# Patient Record
Sex: Female | Born: 1995 | Race: Black or African American | Hispanic: No | Marital: Single | Smoking: Never smoker
Health system: Southern US, Community
[De-identification: ages and names within clinical notes are randomized; demographics above are authoritative.]

## PROBLEM LIST (undated history)

## (undated) DIAGNOSIS — Z789 Other specified health status: Secondary | ICD-10-CM

---

## 2018-09-24 ENCOUNTER — Other Ambulatory Visit: Payer: Self-pay

## 2018-09-24 ENCOUNTER — Ambulatory Visit: Payer: Self-pay

## 2018-09-24 ENCOUNTER — Encounter: Payer: Self-pay | Admitting: Emergency Medicine

## 2018-09-24 ENCOUNTER — Ambulatory Visit
Admission: EM | Admit: 2018-09-24 | Discharge: 2018-09-24 | Disposition: A | Discharge status: Home / Self Care | Payer: Self-pay | Attending: Family Medicine | Admitting: Family Medicine

## 2018-09-24 DIAGNOSIS — M1990 Unspecified osteoarthritis, unspecified site: Secondary | ICD-10-CM | POA: Insufficient documentation

## 2018-09-24 MED ORDER — OSELTAMIVIR PHOSPHATE 75 MG PO CAPS: 75.0000 mg | ORAL_CAPSULE | Freq: Two times a day (BID) | ORAL | 0 refills | Status: DC

## 2018-09-24 MED ORDER — MELOXICAM 15 MG PO TABS: 15.0000 mg | ORAL_TABLET | Freq: Every day | ORAL | 0 refills | Status: DC

## 2018-09-24 NOTE — ED Provider Notes (Signed)
[facility]    CSN: [id number] Arrival date & time: 09/24/18  1234     History   Chief Complaint Chief Complaint  Patient presents with  . Knee Pain  . Shoulder Pain    HPI Amanda Marshall is a 22 y.o. female.   Pt is a 22 year old female that presents with left shoulder and left knee pain. This has been chronic over the last few months. Worsening over the last few weeks. No specific injury. Works long hours and stands on her feet all day. No heavy lifting. No fevers or  chills. She has some intermittent numbness in the left thumb.   ROS per HPI    Knee Pain  Shoulder Pain    History reviewed. No pertinent past medical history.  There are no active problems to display for this patient.   History reviewed. No pertinent surgical history.  OB History   No obstetric history on file.      Home Medications    Prior to Admission medications   Medication Sig Start Date End Date Taking? Authorizing Provider  meloxicam (MOBIC) 15 MG tablet Take 1 tablet (15 mg total) by mouth daily. 09/24/18   Janace ArisBast, Cadience Bradfield A, NP    Family History History reviewed. No pertinent family history.  Social History Social History   Tobacco Use  . Smoking status: Never Smoker  . Smokeless tobacco: Never Used  Substance Use Topics  . Alcohol use: Yes  . Drug use: Never     Allergies   Patient has no allergy information on record.   Review of Systems Review of Systems   Physical Exam Triage Vital Signs ED Triage Vitals  Enc Vitals Group     BP 09/24/18 1255 124/83     Pulse Rate 09/24/18 1255 98     Resp 09/24/18 1255 16     Temp 09/24/18 1255 98 F (36.7 C)     Temp Source 09/24/18 1255 Oral     SpO2 09/24/18 1255 97 %     Weight 09/24/18 1257 213 lb (96.6 kg)     Height 09/24/18 1257 5' (1.524 m)     Head Circumference --      Peak Flow --      Pain Score 09/24/18 1256 8     Pain Loc --      Pain Edu? --      Excl. in GC? --    No data  found.  Updated Vital Signs BP 124/83 (BP Location: Right Arm)   Pulse 98   Temp 98 F (36.7 C) (Oral)   Resp 16   Ht 5' (1.524 m)   Wt 213 lb (96.6 kg)   LMP 09/05/2018   SpO2 97%   BMI 41.60 kg/m   Visual Acuity Right Eye Distance:   Left Eye Distance:   Bilateral Distance:    Right Eye Near:   Left Eye Near:    Bilateral Near:     Physical Exam Vitals signs and nursing note reviewed.  Constitutional:      Appearance: Normal appearance. She is not ill-appearing or toxic-appearing.  HENT:     Head: Normocephalic and atraumatic.     Nose: Nose normal.  Neck:     Musculoskeletal: Normal range of motion.  Pulmonary:     Effort: Pulmonary effort is normal.  Musculoskeletal:     Comments: Tenderness to palpation of the left posterior and anterior shoulder, mostly in relation to the [facility]. No  bruising swelling or deformities.  She is able to extend the left arm but is unable to hyperextend.  Pain elicited with flexion, internal and external rotation of the shoulder.  Tenderness to palpation medial and lateral of the left patella.  Mild swelling.  No deformity, bruising, erythema.  More pain elicited with extension of the knee  Skin:    General: Skin is warm and dry.  Neurological:     Mental Status: She is alert.  Psychiatric:        Mood and Affect: Mood normal.      UC Treatments / Results  Labs (all labs ordered are listed, but only abnormal results are displayed) Labs Reviewed - No data to display  EKG None  Radiology Dg Shoulder Left  Result Date: 09/24/2018 CLINICAL DATA:  Chronic left shoulder pain for several years worsening recently. EXAM: LEFT SHOULDER - 2+ VIEW COMPARISON:  None. FINDINGS: There is no evidence of acute fracture or dislocation. There is no evidence of arthropathy or other focal bone abnormality. Soft tissues are unremarkable. IMPRESSION: No acute osseous abnormality of the left shoulder. Intact AC and glenohumeral joints.  Electronically Signed   By: Tollie Ethavid  Kwon M.D.   On: 09/24/2018 14:37   Dg Knee Complete 4 Views Left  Result Date: 09/24/2018 CLINICAL DATA:  Chronic progressive left knee pain. EXAM: LEFT KNEE - COMPLETE 4+ VIEW COMPARISON:  None. FINDINGS: No evidence of fracture, dislocation, or joint effusion. No evidence of arthropathy or other focal bone abnormality. Soft tissues are unremarkable. IMPRESSION: Negative. Electronically Signed   By: Francene BoyersJames  Maxwell M.D.   On: 09/24/2018 14:39    Procedures Procedures (including critical care time)  Medications Ordered in UC Medications - No data to display  Initial Impression / Assessment and Plan / UC Course  I have reviewed the triage vital signs and the nursing notes.  Pertinent labs & imaging results that were available during my care of the patient were reviewed by me and considered in my medical decision making (see chart for details).     X-ray was negative for any abnormalities. Most likely her pain is related to arthritis We will go ahead and treat with meloxicam daily Knee sleeve given in clinic Rest, ice, elevate She will need to follow-up with orthopedic for continued or worsening symptoms Final Clinical Impressions(s) / UC Diagnoses   Final diagnoses:  Arthritis     Discharge Instructions     Your x-rays were negative for any abnormalities I believe this is most likely arthritis related We will treat this with meloxicam daily.  Make sure you take this with food. We will give you a knee sleeve to wear on your knee I am also going to print out some exercises to do for your shoulder For continued or worsening symptoms you need to follow-up with a primary care or an orthopedic for further imaging to include MRI    ED Prescriptions    Medication Sig Dispense Auth. Provider   oseltamivir (TAMIFLU) 75 MG capsule  (Status: Discontinued) Take 1 capsule (75 mg total) by mouth every 12 (twelve) hours. 10 capsule Edoardo Laforte A, NP    meloxicam (MOBIC) 15 MG tablet Take 1 tablet (15 mg total) by mouth daily. 30 tablet Dahlia ByesBast, Hamdi Kley A, NP     Controlled Substance Prescriptions [place] Controlled Substance Registry consulted? Not Applicable   Janace ArisBast, Cheral Cappucci A, NP 09/24/18 1506

## 2018-09-24 NOTE — Discharge Instructions (Addendum)
Your x-rays were negative for any abnormalities I believe this is most likely arthritis related We will treat this with meloxicam daily.  Make sure you take this with food. We will give you a knee sleeve to wear on your knee I am also going to print out some exercises to do for your shoulder For continued or worsening symptoms you need to follow-up with a primary care or an orthopedic for further imaging to include MRI

## 2018-09-24 NOTE — ED Triage Notes (Signed)
Per pt she has been having left shoulder and knee pain for several months but for the past few days has gotten worse with pain and movement.

## 2018-11-10 ENCOUNTER — Ambulatory Visit (HOSPITAL_COMMUNITY)
Admission: EM | Admit: 2018-11-10 | Discharge: 2018-11-10 | Disposition: A | Discharge status: Home / Self Care | Payer: Self-pay | Attending: Family Medicine | Admitting: Family Medicine

## 2018-11-10 ENCOUNTER — Encounter (HOSPITAL_COMMUNITY): Payer: Self-pay | Admitting: Emergency Medicine

## 2018-11-10 ENCOUNTER — Other Ambulatory Visit: Payer: Self-pay

## 2018-11-10 DIAGNOSIS — H9201 Otalgia, right ear: Secondary | ICD-10-CM

## 2018-11-10 DIAGNOSIS — J029 Acute pharyngitis, unspecified: Secondary | ICD-10-CM

## 2018-11-10 DIAGNOSIS — H6691 Otitis media, unspecified, right ear: Secondary | ICD-10-CM

## 2018-11-10 MED ORDER — AMOXICILLIN-POT CLAVULANATE 400-57 MG PO CHEW: 2.0000 | CHEWABLE_TABLET | Freq: Two times a day (BID) | ORAL | 0 refills | Status: AC

## 2018-11-10 NOTE — Discharge Instructions (Signed)
Recommend start Augmentin chewable tablets- take 2 every 12 hours for 7 days. May take Ibuprofen 600mg  or Aleve 2 tablets as directed for pain. May continue Nyquil as needed for cough. Follow-up here in 3 to 4 days if not improving.

## 2018-11-10 NOTE — ED Provider Notes (Signed)
[facility]    CSN: [id number] Arrival date & time: 11/10/18  2563     History   Chief Complaint Chief Complaint  Patient presents with  . Sore Throat    HPI Amanda Marshall is a 23 y.o. female.   23 year old female presents with sore throat for the past 3 weeks. Also has had slight nasal congestion and cough. Had a fever 2 weeks ago but resolved. Now woke up this morning with right ear pain. Denies any GI symptoms. History of frequent ear infections- last one about 1 year ago. Has taken Nyquil and gargled with salt water with minimal relief. No other chronic health issues. Takes no daily medication.   The history is provided by the patient.    History reviewed. No pertinent past medical history.  There are no active problems to display for this patient.   History reviewed. No pertinent surgical history.  OB History   No obstetric history on file.      Home Medications    Prior to Admission medications   Medication Sig Start Date End Date Taking? Authorizing Provider  amoxicillin-clavulanate (AUGMENTIN) 400-57 MG chewable tablet Chew 2 tablets by mouth 2 (two) times daily for 7 days. 11/10/18 11/17/18  Sudie Grumbling, NP    Family History Family History  Problem Relation Age of Onset  . Hypertension Mother   . Diabetes Mother     Social History Social History   Tobacco Use  . Smoking status: Never Smoker  . Smokeless tobacco: Never Used  Substance Use Topics  . Alcohol use: Yes  . Drug use: Never     Allergies   Patient has no known allergies.   Review of Systems Review of Systems  Constitutional: Positive for fatigue. Negative for activity change, appetite change, chills and fever (not recently).  HENT: Positive for congestion, ear pain (right), postnasal drip and sore throat. Negative for ear discharge, facial swelling, hearing loss, mouth sores, nosebleeds, rhinorrhea, sinus pressure, sinus pain, sneezing and trouble swallowing.     Eyes: Negative for pain, discharge, redness and itching.  Respiratory: Positive for cough. Negative for chest tightness, shortness of breath and wheezing.   Gastrointestinal: Negative for abdominal pain, diarrhea, nausea and vomiting.  Musculoskeletal: Negative for arthralgias, myalgias, neck pain and neck stiffness.  Skin: Negative for color change, rash and wound.  Neurological: Positive for headaches. Negative for dizziness, tremors, seizures, syncope, weakness, light-headedness and numbness.  Hematological: Negative for adenopathy. Does not bruise/bleed easily.     Physical Exam Triage Vital Signs ED Triage Vitals  Enc Vitals Group     BP 11/10/18 0936 (!) 141/77     Pulse Rate 11/10/18 0936 91     Resp 11/10/18 0936 20     Temp 11/10/18 0936 98.7 F (37.1 C)     Temp Source 11/10/18 0936 Oral     SpO2 11/10/18 0936 98 %     Weight --      Height --      Head Circumference --      Peak Flow --      Pain Score 11/10/18 0940 10     Pain Loc --      Pain Edu? --      Excl. in GC? --    No data found.  Updated Vital Signs BP (!) 141/77 (BP Location: Left Arm)   Pulse 91   Temp 98.7 F (37.1 C) (Oral)   Resp 20   LMP 10/16/2018  SpO2 98%   Visual Acuity Right Eye Distance:   Left Eye Distance:   Bilateral Distance:    Right Eye Near:   Left Eye Near:    Bilateral Near:     Physical Exam Vitals signs and nursing note reviewed.  Constitutional:      General: She is awake. She is not in acute distress.    Appearance: She is well-developed and well-groomed. She is ill-appearing.     Comments: Patient sitting comfortably on exam table in no acute distress but is holding her right ear and appears in pain.   HENT:     Head: Normocephalic and atraumatic.     Right Ear: Hearing, ear canal and external ear normal. No drainage. Tympanic membrane is injected, erythematous and bulging.     Left Ear: Hearing, ear canal and external ear normal. No drainage. Tympanic  membrane is bulging. Tympanic membrane is not injected or erythematous.     Nose: Congestion present. No rhinorrhea.     Right Sinus: No maxillary sinus tenderness or frontal sinus tenderness.     Left Sinus: No maxillary sinus tenderness or frontal sinus tenderness.     Mouth/Throat:     Lips: Pink.     Mouth: Mucous membranes are moist.     Pharynx: Uvula midline. Posterior oropharyngeal erythema present. No pharyngeal swelling or oropharyngeal exudate.  Eyes:     Extraocular Movements: Extraocular movements intact.     Conjunctiva/sclera: Conjunctivae normal.  Neck:     Musculoskeletal: Normal range of motion and neck supple.  Cardiovascular:     Rate and Rhythm: Normal rate and regular rhythm.     Heart sounds: Normal heart sounds. No murmur.  Pulmonary:     Effort: Pulmonary effort is normal. No respiratory distress.     Breath sounds: Normal breath sounds and air entry. No decreased air movement. No decreased breath sounds, wheezing, rhonchi or rales.  Musculoskeletal: Normal range of motion.  Lymphadenopathy:     Cervical: Cervical adenopathy present.     Right cervical: Superficial cervical adenopathy (and tender) present.     Left cervical: Superficial cervical adenopathy present.  Skin:    General: Skin is warm and dry.     Capillary Refill: Capillary refill takes less than 2 seconds.     Findings: No rash.  Neurological:     General: No focal deficit present.     Mental Status: She is alert and oriented to person, place, and time.  Psychiatric:        Mood and Affect: Mood normal.        Behavior: Behavior normal. Behavior is cooperative.      UC Treatments / Results  Labs (all labs ordered are listed, but only abnormal results are displayed) Labs Reviewed - No data to display  EKG None  Radiology No results found.  Procedures Procedures (including critical care time)  Medications Ordered in UC Medications - No data to display  Initial Impression /  Assessment and Plan / UC Course  I have reviewed the triage vital signs and the nursing notes.  Pertinent labs & imaging results that were available during my care of the patient were reviewed by me and considered in my medical decision making (see chart for details).    Discussed with patient that she has an inner ear infection. Since patient has difficulty swallowing larger pills, will start Augmentin chewable tablets 800mg  twice a day as directed. May take Ibuprofen 600mg  every 8 hours or Aleve  2 tablets every 12 hours as needed for pain. May continue Nyquil qhs as needed for cough. Follow-up here in 3 to 4 days if not improving.   Final Clinical Impressions(s) / UC Diagnoses   Final diagnoses:  Right acute otitis media  Right ear pain  Sore throat     Discharge Instructions     Recommend start Augmentin chewable tablets- take 2 every 12 hours for 7 days. May take Ibuprofen 600mg  or Aleve 2 tablets as directed for pain. May continue Nyquil as needed for cough. Follow-up here in 3 to 4 days if not improving.     ED Prescriptions    Medication Sig Dispense Auth. Provider   amoxicillin-clavulanate (AUGMENTIN) 400-57 MG chewable tablet Chew 2 tablets by mouth 2 (two) times daily for 7 days. 28 tablet Sudie GrumblingAmyot, Shandrell Boda Berry, NP     Controlled Substance Prescriptions [place] Controlled Substance Registry consulted? Not Applicable   Sudie Grumblingmyot, Sonya Gunnoe Berry, NP 11/10/18 1645

## 2018-11-10 NOTE — ED Triage Notes (Signed)
Throat pain for 3 weeks, right ear pain started this morning

## 2019-05-11 ENCOUNTER — Encounter (HOSPITAL_COMMUNITY)
Admission: EM | Disposition: A | Discharge status: Home / Self Care | Payer: Self-pay | Source: Home / Self Care | Attending: Emergency Medicine

## 2019-05-11 ENCOUNTER — Emergency Department (HOSPITAL_COMMUNITY): Payer: Self-pay

## 2019-05-11 ENCOUNTER — Emergency Department (HOSPITAL_COMMUNITY): Payer: Self-pay | Admitting: Certified Registered"

## 2019-05-11 ENCOUNTER — Other Ambulatory Visit: Payer: Self-pay

## 2019-05-11 ENCOUNTER — Encounter (HOSPITAL_COMMUNITY): Payer: Self-pay | Admitting: Emergency Medicine

## 2019-05-11 ENCOUNTER — Observation Stay (HOSPITAL_COMMUNITY)
Admission: EM | Admit: 2019-05-11 | Discharge: 2019-05-12 | Disposition: A | Discharge status: Home / Self Care | Payer: Self-pay | Attending: General Surgery | Admitting: General Surgery

## 2019-05-11 DIAGNOSIS — I10 Essential (primary) hypertension: Secondary | ICD-10-CM | POA: Insufficient documentation

## 2019-05-11 DIAGNOSIS — K819 Cholecystitis, unspecified: Secondary | ICD-10-CM | POA: Diagnosis present

## 2019-05-11 DIAGNOSIS — K8010 Calculus of gallbladder with chronic cholecystitis without obstruction: Principal | ICD-10-CM | POA: Insufficient documentation

## 2019-05-11 DIAGNOSIS — Z20828 Contact with and (suspected) exposure to other viral communicable diseases: Secondary | ICD-10-CM | POA: Insufficient documentation

## 2019-05-11 DIAGNOSIS — R1011 Right upper quadrant pain: Secondary | ICD-10-CM

## 2019-05-11 HISTORY — PX: CHOLECYSTECTOMY: SHX55

## 2019-05-11 HISTORY — DX: Other specified health status: Z78.9

## 2019-05-11 LAB — CBC
HCT: 39.4 % (ref 36.0–46.0)
Hemoglobin: 12.7 g/dL (ref 12.0–15.0)
MCH: 27 pg (ref 26.0–34.0)
MCHC: 32.2 g/dL (ref 30.0–36.0)
MCV: 83.7 fL (ref 80.0–100.0)
Platelets: 378 10*3/uL (ref 150–400)
RBC: 4.71 MIL/uL (ref 3.87–5.11)
RDW: 14.5 % (ref 11.5–15.5)
WBC: 17 10*3/uL — ABNORMAL HIGH (ref 4.0–10.5)
nRBC: 0 % (ref 0.0–0.2)

## 2019-05-11 LAB — COMPREHENSIVE METABOLIC PANEL
ALT: 22 U/L (ref 0–44)
AST: 23 U/L (ref 15–41)
Albumin: 4.1 g/dL (ref 3.5–5.0)
Alkaline Phosphatase: 77 U/L (ref 38–126)
Anion gap: 11 (ref 5–15)
BUN: 9 mg/dL (ref 6–20)
CO2: 25 mmol/L (ref 22–32)
Calcium: 9.6 mg/dL (ref 8.9–10.3)
Chloride: 101 mmol/L (ref 98–111)
Creatinine, Ser: 0.8 mg/dL (ref 0.44–1.00)
GFR calc Af Amer: >60 mL/min (ref >60)
GFR calc non Af Amer: >60 mL/min (ref >60)
Glucose, Bld: 135 mg/dL — ABNORMAL HIGH (ref 70–99)
Potassium: 4.4 mmol/L (ref 3.5–5.1)
Sodium: 137 mmol/L (ref 135–145)
Total Bilirubin: 0.5 mg/dL (ref 0.3–1.2)
Total Protein: 7.8 g/dL (ref 6.5–8.1)

## 2019-05-11 LAB — URINALYSIS, ROUTINE W REFLEX MICROSCOPIC
Bacteria, UA: NONE SEEN
Bilirubin Urine: NEGATIVE
Glucose, UA: NEGATIVE mg/dL
Hgb urine dipstick: NEGATIVE
Ketones, ur: 5 mg/dL — AB
Leukocytes,Ua: NEGATIVE
Nitrite: NEGATIVE
Protein, ur: 30 mg/dL — AB
Specific Gravity, Urine: 1.028 (ref 1.005–1.030)
pH: 6 (ref 5.0–8.0)

## 2019-05-11 LAB — I-STAT BETA HCG BLOOD, ED (MC, WL, AP ONLY): I-stat hCG, quantitative: <5 m[IU]/mL (ref <5)

## 2019-05-11 LAB — SARS CORONAVIRUS 2 BY RT PCR (HOSPITAL ORDER, PERFORMED IN ~~LOC~~ HOSPITAL LAB): SARS Coronavirus 2: NEGATIVE

## 2019-05-11 LAB — LIPASE, BLOOD: Lipase: 25 U/L (ref 11–51)

## 2019-05-11 SURGERY — LAPAROSCOPIC CHOLECYSTECTOMY
Anesthesia: General | Site: Abdomen

## 2019-05-11 MED ORDER — BUPIVACAINE-EPINEPHRINE (PF) 0.25% -1:200000 IJ SOLN
INTRAMUSCULAR | Status: AC
  Filled 2019-05-11: qty 30

## 2019-05-11 MED ORDER — MIDAZOLAM HCL 2 MG/2ML IJ SOLN
INTRAMUSCULAR | Status: DC | PRN
  Administered 2019-05-11: 2 mg via INTRAVENOUS

## 2019-05-11 MED ORDER — PHENYLEPHRINE 40 MCG/ML (10ML) SYRINGE FOR IV PUSH (FOR BLOOD PRESSURE SUPPORT)
PREFILLED_SYRINGE | INTRAVENOUS | Status: AC
  Filled 2019-05-11: qty 10

## 2019-05-11 MED ORDER — PROPOFOL 10 MG/ML IV BOLUS
INTRAVENOUS | Status: DC | PRN
  Administered 2019-05-11: 50 mg via INTRAVENOUS
  Administered 2019-05-11: 20 mg via INTRAVENOUS
  Administered 2019-05-11: 180 mg via INTRAVENOUS

## 2019-05-11 MED ORDER — LACTATED RINGERS IV SOLN
INTRAVENOUS | Status: DC
  Administered 2019-05-11: 13:00:00 via INTRAVENOUS

## 2019-05-11 MED ORDER — SODIUM CHLORIDE 0.9% FLUSH: 3.0000 mL | Freq: Once | INTRAVENOUS | Status: DC

## 2019-05-11 MED ORDER — LIDOCAINE 2% (20 MG/ML) 5 ML SYRINGE
INTRAMUSCULAR | Status: DC | PRN
  Administered 2019-05-11: 60 mg via INTRAVENOUS

## 2019-05-11 MED ORDER — FENTANYL CITRATE (PF) 250 MCG/5ML IJ SOLN
INTRAMUSCULAR | Status: AC
  Filled 2019-05-11: qty 5

## 2019-05-11 MED ORDER — SODIUM CHLORIDE 0.9 % IV SOLN
2.0000 g | INTRAVENOUS | Status: DC
  Administered 2019-05-11: 14:00:00 2 g via INTRAVENOUS
  Filled 2019-05-11 (×2): qty 20

## 2019-05-11 MED ORDER — STERILE WATER FOR IRRIGATION IR SOLN
Status: DC | PRN
  Administered 2019-05-11: 1000 mL

## 2019-05-11 MED ORDER — ROCURONIUM BROMIDE 10 MG/ML (PF) SYRINGE
PREFILLED_SYRINGE | INTRAVENOUS | Status: AC
  Filled 2019-05-11: qty 10

## 2019-05-11 MED ORDER — DEXAMETHASONE SODIUM PHOSPHATE 10 MG/ML IJ SOLN
INTRAMUSCULAR | Status: DC | PRN
  Administered 2019-05-11: 10 mg via INTRAVENOUS

## 2019-05-11 MED ORDER — OXYCODONE HCL 5 MG/5ML PO SOLN: 5.0000 mg | Freq: Once | ORAL | Status: AC | PRN

## 2019-05-11 MED ORDER — SODIUM CHLORIDE 0.9 % IV SOLN
INTRAVENOUS | Status: DC
  Administered 2019-05-11: 11:00:00 via INTRAVENOUS

## 2019-05-11 MED ORDER — 0.9 % SODIUM CHLORIDE (POUR BTL) OPTIME
TOPICAL | Status: DC | PRN
  Administered 2019-05-11: 1000 mL

## 2019-05-11 MED ORDER — CHLORHEXIDINE GLUCONATE CLOTH 2 % EX PADS: 6.0000 | MEDICATED_PAD | Freq: Once | CUTANEOUS | Status: DC

## 2019-05-11 MED ORDER — OXYCODONE HCL 5 MG PO TABS
5.0000 mg | ORAL_TABLET | Freq: Once | ORAL | Status: AC | PRN
  Administered 2019-05-11: 5 mg via ORAL

## 2019-05-11 MED ORDER — FENTANYL CITRATE (PF) 100 MCG/2ML IJ SOLN: 25.0000 ug | INTRAMUSCULAR | Status: DC | PRN

## 2019-05-11 MED ORDER — FENTANYL CITRATE (PF) 100 MCG/2ML IJ SOLN
100.0000 ug | Freq: Once | INTRAMUSCULAR | Status: AC
  Administered 2019-05-11: 100 ug via INTRAVENOUS
  Filled 2019-05-11: qty 2

## 2019-05-11 MED ORDER — ENOXAPARIN SODIUM 60 MG/0.6ML ~~LOC~~ SOLN
50.0000 mg | SUBCUTANEOUS | Status: DC
  Administered 2019-05-12: 50 mg via SUBCUTANEOUS
  Filled 2019-05-11: qty 0.6

## 2019-05-11 MED ORDER — SODIUM CHLORIDE 0.9 % IV SOLN
INTRAVENOUS | Status: DC
  Administered 2019-05-11: 18:00:00 via INTRAVENOUS

## 2019-05-11 MED ORDER — OXYCODONE HCL 5 MG PO TABS
5.0000 mg | ORAL_TABLET | Freq: Four times a day (QID) | ORAL | Status: DC | PRN
  Administered 2019-05-12: 5 mg via ORAL
  Filled 2019-05-11: qty 1

## 2019-05-11 MED ORDER — KETOROLAC TROMETHAMINE 15 MG/ML IJ SOLN
15.0000 mg | Freq: Four times a day (QID) | INTRAMUSCULAR | Status: DC
  Administered 2019-05-11 – 2019-05-12 (×3): 15 mg via INTRAVENOUS
  Filled 2019-05-11 (×3): qty 1

## 2019-05-11 MED ORDER — MIDAZOLAM HCL 2 MG/2ML IJ SOLN
INTRAMUSCULAR | Status: AC
  Filled 2019-05-11: qty 2

## 2019-05-11 MED ORDER — ONDANSETRON HCL 4 MG/2ML IJ SOLN: 4.0000 mg | Freq: Once | INTRAMUSCULAR | Status: DC

## 2019-05-11 MED ORDER — ONDANSETRON HCL 4 MG/2ML IJ SOLN
4.0000 mg | Freq: Once | INTRAMUSCULAR | Status: AC
  Administered 2019-05-11: 4 mg via INTRAVENOUS
  Filled 2019-05-11: qty 2

## 2019-05-11 MED ORDER — ACETAMINOPHEN 500 MG PO TABS
1000.0000 mg | ORAL_TABLET | Freq: Four times a day (QID) | ORAL | Status: DC
  Administered 2019-05-11 – 2019-05-12 (×4): 1000 mg via ORAL
  Filled 2019-05-11 (×4): qty 2

## 2019-05-11 MED ORDER — GABAPENTIN 300 MG PO CAPS
300.0000 mg | ORAL_CAPSULE | ORAL | Status: AC
  Administered 2019-05-11: 300 mg via ORAL
  Filled 2019-05-11: qty 1

## 2019-05-11 MED ORDER — SUCCINYLCHOLINE CHLORIDE 20 MG/ML IJ SOLN
INTRAMUSCULAR | Status: DC | PRN
  Administered 2019-05-11: 140 mg via INTRAVENOUS

## 2019-05-11 MED ORDER — BUPIVACAINE-EPINEPHRINE 0.25% -1:200000 IJ SOLN
INTRAMUSCULAR | Status: DC | PRN
  Administered 2019-05-11: 13 mL

## 2019-05-11 MED ORDER — PROPOFOL 10 MG/ML IV BOLUS
INTRAVENOUS | Status: AC
  Filled 2019-05-11: qty 20

## 2019-05-11 MED ORDER — DEXAMETHASONE SODIUM PHOSPHATE 10 MG/ML IJ SOLN
INTRAMUSCULAR | Status: AC
  Filled 2019-05-11: qty 1

## 2019-05-11 MED ORDER — ACETAMINOPHEN 500 MG PO TABS
1000.0000 mg | ORAL_TABLET | ORAL | Status: AC
  Administered 2019-05-11: 13:00:00 1000 mg via ORAL
  Filled 2019-05-11: qty 2

## 2019-05-11 MED ORDER — OXYCODONE HCL 5 MG PO TABS
ORAL_TABLET | ORAL | Status: AC
  Filled 2019-05-11: qty 1

## 2019-05-11 MED ORDER — ONDANSETRON 4 MG PO TBDP: 4.0000 mg | ORAL_TABLET | Freq: Four times a day (QID) | ORAL | Status: DC | PRN

## 2019-05-11 MED ORDER — SUGAMMADEX SODIUM 200 MG/2ML IV SOLN
INTRAVENOUS | Status: DC | PRN
  Administered 2019-05-11: 200 mg via INTRAVENOUS

## 2019-05-11 MED ORDER — ONDANSETRON HCL 4 MG/2ML IJ SOLN: 4.0000 mg | Freq: Four times a day (QID) | INTRAMUSCULAR | Status: DC | PRN

## 2019-05-11 MED ORDER — SIMETHICONE 80 MG PO CHEW: 40.0000 mg | CHEWABLE_TABLET | Freq: Four times a day (QID) | ORAL | Status: DC | PRN

## 2019-05-11 MED ORDER — ROCURONIUM BROMIDE 10 MG/ML (PF) SYRINGE
PREFILLED_SYRINGE | INTRAVENOUS | Status: DC | PRN
  Administered 2019-05-11: 50 mg via INTRAVENOUS

## 2019-05-11 MED ORDER — SUCCINYLCHOLINE CHLORIDE 200 MG/10ML IV SOSY
PREFILLED_SYRINGE | INTRAVENOUS | Status: AC
  Filled 2019-05-11: qty 10

## 2019-05-11 MED ORDER — METHOCARBAMOL 500 MG PO TABS: 500.0000 mg | ORAL_TABLET | Freq: Four times a day (QID) | ORAL | Status: DC | PRN

## 2019-05-11 MED ORDER — ONDANSETRON HCL 4 MG/2ML IJ SOLN
INTRAMUSCULAR | Status: DC | PRN
  Administered 2019-05-11: 4 mg via INTRAVENOUS

## 2019-05-11 MED ORDER — LIDOCAINE 2% (20 MG/ML) 5 ML SYRINGE
INTRAMUSCULAR | Status: AC
  Filled 2019-05-11: qty 5

## 2019-05-11 MED ORDER — FENTANYL CITRATE (PF) 100 MCG/2ML IJ SOLN
INTRAMUSCULAR | Status: DC | PRN
  Administered 2019-05-11: 50 ug via INTRAVENOUS
  Administered 2019-05-11 (×2): 100 ug via INTRAVENOUS
  Administered 2019-05-11 (×2): 50 ug via INTRAVENOUS

## 2019-05-11 MED ORDER — MORPHINE SULFATE (PF) 4 MG/ML IV SOLN
4.0000 mg | Freq: Once | INTRAVENOUS | Status: AC
  Administered 2019-05-11: 06:00:00 4 mg via INTRAVENOUS
  Filled 2019-05-11: qty 1

## 2019-05-11 MED ORDER — SODIUM CHLORIDE 0.9 % IR SOLN
Status: DC | PRN
  Administered 2019-05-11: 1000 mL

## 2019-05-11 MED ORDER — HYDROMORPHONE HCL 1 MG/ML IJ SOLN: 1.0000 mg | Freq: Once | INTRAMUSCULAR | Status: DC

## 2019-05-11 MED ORDER — MORPHINE SULFATE (PF) 2 MG/ML IV SOLN: 1.0000 mg | INTRAVENOUS | Status: DC | PRN

## 2019-05-11 SURGICAL SUPPLY — 43 items
APPLIER CLIP 5 13 M/L LIGAMAX5 (MISCELLANEOUS) ×3
BLADE CLIPPER SURG (BLADE) ×3 IMPLANT
CANISTER SUCT 3000ML PPV (MISCELLANEOUS) ×3 IMPLANT
CHLORAPREP W/TINT 26 (MISCELLANEOUS) ×3 IMPLANT
CLIP APPLIE 5 13 M/L LIGAMAX5 (MISCELLANEOUS) ×1 IMPLANT
CLOSURE WOUND 1/2 X4 (GAUZE/BANDAGES/DRESSINGS) ×1
COVER SURGICAL LIGHT HANDLE (MISCELLANEOUS) ×3 IMPLANT
DERMABOND ADVANCED (GAUZE/BANDAGES/DRESSINGS) ×2
DERMABOND ADVANCED .7 DNX12 (GAUZE/BANDAGES/DRESSINGS) ×1 IMPLANT
DEVICE TROCAR PUNCTURE CLOSURE (ENDOMECHANICALS) ×3 IMPLANT
ELECT REM PT RETURN 9FT ADLT (ELECTROSURGICAL) ×3
ELECTRODE REM PT RTRN 9FT ADLT (ELECTROSURGICAL) ×1 IMPLANT
GLOVE BIO SURGEON STRL SZ 6.5 (GLOVE) ×2 IMPLANT
GLOVE BIO SURGEON STRL SZ7 (GLOVE) ×3 IMPLANT
GLOVE BIO SURGEON STRL SZ7.5 (GLOVE) ×3 IMPLANT
GLOVE BIO SURGEONS STRL SZ 6.5 (GLOVE) ×1
GLOVE BIOGEL PI IND STRL 7.0 (GLOVE) ×1 IMPLANT
GLOVE BIOGEL PI IND STRL 7.5 (GLOVE) ×3 IMPLANT
GLOVE BIOGEL PI INDICATOR 7.0 (GLOVE) ×2
GLOVE BIOGEL PI INDICATOR 7.5 (GLOVE) ×6
GLOVE ECLIPSE 7.5 STRL STRAW (GLOVE) ×3 IMPLANT
GOWN STRL REUS W/ TWL LRG LVL3 (GOWN DISPOSABLE) ×4 IMPLANT
GOWN STRL REUS W/TWL LRG LVL3 (GOWN DISPOSABLE) ×8
GRASPER SUT TROCAR 14GX15 (MISCELLANEOUS) ×3 IMPLANT
KIT BASIN OR (CUSTOM PROCEDURE TRAY) ×3 IMPLANT
KIT TURNOVER KIT B (KITS) ×3 IMPLANT
NS IRRIG 1000ML POUR BTL (IV SOLUTION) ×3 IMPLANT
PAD ARMBOARD 7.5X6 YLW CONV (MISCELLANEOUS) ×3 IMPLANT
POUCH RETRIEVAL ECOSAC 10 (ENDOMECHANICALS) ×1 IMPLANT
POUCH RETRIEVAL ECOSAC 10MM (ENDOMECHANICALS) ×2
SCISSORS LAP 5X35 DISP (ENDOMECHANICALS) ×3 IMPLANT
SET IRRIG TUBING LAPAROSCOPIC (IRRIGATION / IRRIGATOR) ×3 IMPLANT
SET TUBE SMOKE EVAC HIGH FLOW (TUBING) ×3 IMPLANT
SLEEVE ENDOPATH XCEL 5M (ENDOMECHANICALS) ×6 IMPLANT
SPECIMEN JAR SMALL (MISCELLANEOUS) ×3 IMPLANT
STRIP CLOSURE SKIN 1/2X4 (GAUZE/BANDAGES/DRESSINGS) ×2 IMPLANT
SUT MNCRL AB 4-0 PS2 18 (SUTURE) ×3 IMPLANT
SUT VICRYL 0 UR6 27IN ABS (SUTURE) ×6 IMPLANT
TOWEL GREEN STERILE FF (TOWEL DISPOSABLE) ×3 IMPLANT
TRAY LAPAROSCOPIC MC (CUSTOM PROCEDURE TRAY) ×3 IMPLANT
TROCAR XCEL BLUNT TIP 100MML (ENDOMECHANICALS) ×3 IMPLANT
TROCAR XCEL NON-BLD 5MMX100MML (ENDOMECHANICALS) ×3 IMPLANT
WATER STERILE IRR 1000ML POUR (IV SOLUTION) ×3 IMPLANT

## 2019-05-11 NOTE — Anesthesia Preprocedure Evaluation (Addendum)
Anesthesia Evaluation  Patient identified by MRN, date of birth, ID band Patient awake    Reviewed: Allergy & Precautions, H&P , NPO status , Patient's Chart, lab work & pertinent test results  Airway Mallampati: II  TM Distance: >3 FB Neck ROM: Full    Dental  (+) Teeth Intact, Dental Advisory Given   Pulmonary neg pulmonary ROS,    breath sounds clear to auscultation       Cardiovascular negative cardio ROS   Rhythm:regular Rate:Normal     Neuro/Psych    GI/Hepatic cholecystitis   Endo/Other    Renal/GU      Musculoskeletal   Abdominal   Peds  Hematology   Anesthesia Other Findings   Reproductive/Obstetrics                            Anesthesia Physical Anesthesia Plan  ASA: II  Anesthesia Plan: General   Post-op Pain Management:    Induction: Intravenous, Rapid sequence and Cricoid pressure planned  PONV Risk Score and Plan: 3 and Ondansetron, Dexamethasone, Midazolam and Treatment may vary due to age or medical condition  Airway Management Planned: Oral ETT  Additional Equipment:   Intra-op Plan:   Post-operative Plan: Extubation in OR  Informed Consent: I have reviewed the patients History and Physical, chart, labs and discussed the procedure including the risks, benefits and alternatives for the proposed anesthesia with the patient or authorized representative who has indicated his/her understanding and acceptance.     Dental advisory given  Plan Discussed with: CRNA, Anesthesiologist and Surgeon  Anesthesia Plan Comments:        Anesthesia Quick Evaluation

## 2019-05-11 NOTE — ED Provider Notes (Addendum)
  Physical Exam  BP (!) 107/97   Pulse 82   Temp 98 F (36.7 C) (Oral)   Resp 16   SpO2 99%   Physical Exam  ED Course/Procedures   Clinical Course as of May 10 716  Thu May 11, 2019  0715 Patient recheck.  She continues to endorse severe, 9 out of 10 pain.  Given the patient's current blood pressure is 107/97, will order fentanyl and plan for reassessment.  If he is not improved on reassessment, will consider general surgery consult.   [MM]    Clinical Course User Index [MM] McDonald, Mia A, PA-C    Procedures  MDM  7:17 AM Patient care assumed from [place] PA at shift change, please see her note for a full HPI. Briefly, patient with 3 weeks of right upper quadrant pain, waxing and waning worse after eating.  No vomiting, has some nausea.  Had ultrasound performed which shows 82mm gallbladder stones.  Does have a white count of 17.0, pain was not controlled after 4 mg of morphine, did receive an additional 100 mics of fentanyl as her pressures were noted to be soft 107/97 will reassess patient after +/-General surgery consult.  07:45 AM patient reevaluated by me, reports some improvement in pain after fentanyl.  Will attempt p.o. challenge. 8:50 AM patient reevaluated by me, pain has now returned on the right upper quadrant, she reports nausea.  Zofran has been ordered and a general surgery consult has been placed, spoke to [place] PA who will evaluate patient in the ED.  Pressures remain soft 11/53, will hold off on medication until general surgery recommendations.  9:24 AM surgery consult pending, COVID swab ordered. 10:38 AM surgery consult performed by Janett Billow PA, please see her note for a full HPI.  Briefly, patient likely to go for cholecystectomy today, unknown on time.  Likely will be discharged from PACU.   10:54 AM patient rechecked by me, reports her pain is now at a 3, she is currently receiving a liter of fluids.  Patient likely to go for surgery today updated on  standby orders per surgery.   Portions of this note were generated with [employer]. Dictation errors may occur despite best attempts at proofreading.    Janeece Fitting, PA-C 05/11/19 1039    Janeece Fitting, PA-C 05/11/19 1054    Hayden Rasmussen, MD 05/11/19 [phone]

## 2019-05-11 NOTE — Op Note (Signed)
Preoperative diagnosis:acute cholecystitis Postoperative diagnosis: Same as above Chronic cholecystitis Procedure: Laparoscopic cholecystectomy Surgeon: Dr. Serita Grammes Assistant: Margie Billet, PA-C Anesthesia: General Specimens: Gallbladder and contents to pathology Complications: None Drains: None Sponge needle count was correct at completion Disposition to recovery stable condition  Indications: This is a 63 yof with ruq pain, gallstones on [place] and a tender ruq. She appears to have cholecystitis.   I discussed with her proceeding with a laparoscopic cholecystectomy.   Procedure: After informed consent was obtained the patient was taken to the OR.    Antibiotics had been given.  SCDs were in place.  She was then prepped and draped in the standard sterile surgical fashion after already undergoing general anesthesia. Surgical timeout was then performed.  I infiltrated Marcaine below the umbilicus.  I made a vertical incision.  I grasped the fascia and incised this sharply.  The peritoneum was entered bluntly.  I then placed a 0 Vicryl pursestring suture through the fascia.  I inserted a Hassan trocar and insufflated the abdomen to 15 mmHg pressure.  I then placed 3 further 5 mm trocars in the epigastrium and right upper quadrant under direct vision without complication.  She was noted to have acute cholecystitis.  Her gallbladder was retracted cephalad and lateral.  I was able to dissect the triangle and get the critical view of safety.  I then clipped the cystic duct leaving 2 clips in place.  These clips completely traversed the duct and the duct was viable.  I treated the cystic artery in a similar fashion.   Cautery was then used to remove the gallbladder from the liver bed.  It was then placed in a retrieval bag and removed the umbilicus.  Hemostasis was observed.  There was no spillage of bile.  I then placed and I then remove the [facility] trocar and tied down my pursestring.  Due to her  body habitus I did place 3 additional 0 Vicryl sutures using the suture passer device at the umbilicus.  The remaining trocars were removed and the abdomen was desufflated.  These were all closed with 4-0 Monocryl, glue, and Steri-Strips.  She tolerated this well was extubated and transferred to recovery stable.

## 2019-05-11 NOTE — H&P (Addendum)
[facility] Surgery Consult/Admission Note  Amanda Marshall 1995/10/15  [id number].    Requesting Provider: Dr. Melina Copa Chief Complaint/Reason for Consult: RUQ abdominal pain  HPI:   Pt is an otherwise healthy, obese, 23 yo who presented to the ED with complaints of abdominal pain. Pt states she has been having intermittent, severe, RUQ abdominal pain, radiating into her back after eating for the last 3 weeks. She never had this pain before. Associated N&V. She has been having bouts of diarrhea followed by constipation since the onset of pain. No blood in stool or vomiting. No fevers or chills, urinary issues or other complaints. She is a stay at home mom. She denies previous abdominal surgeries, blood thinners or daily medications. No known allergies.   ROS:  Review of Systems  Constitutional: Negative for chills, diaphoresis and fever.  HENT: Negative for sore throat.   Respiratory: Negative for cough and shortness of breath.   Cardiovascular: Negative for chest pain.  Gastrointestinal: Positive for abdominal pain, constipation, diarrhea, nausea and vomiting. Negative for blood in stool.  Genitourinary: Negative for dysuria.  Skin: Negative for rash.  Neurological: Negative for dizziness and loss of consciousness.  All other systems reviewed and are negative.    Family History  Problem Relation Age of Onset  . Hypertension Mother   . Diabetes Mother     History reviewed. No pertinent past medical history.  History reviewed. No pertinent surgical history.  Social History:  reports that she has never smoked. She has never used smokeless tobacco. She reports current alcohol use. She reports that she does not use drugs.  Allergies: No Known Allergies  (Not in a hospital admission)   Blood pressure (!) 99/53, pulse 66, temperature 98 F (36.7 C), temperature source Oral, resp. rate 16, SpO2 97 %.  Physical Exam Constitutional:      General: She is not in acute  distress.    Appearance: Normal appearance. She is obese. She is not ill-appearing, toxic-appearing or diaphoretic.  HENT:     Head: Normocephalic and atraumatic.     Nose: Nose normal.     Mouth/Throat:     Comments: Pt wearing mask Eyes:     General: No scleral icterus.       Right eye: No discharge.        Left eye: No discharge.     Conjunctiva/sclera: Conjunctivae normal.     Pupils: Pupils are equal, round, and reactive to light.  Neck:     Musculoskeletal: Normal range of motion and neck supple.  Cardiovascular:     Rate and Rhythm: Normal rate and regular rhythm.     Pulses:          Radial pulses are 2+ on the right side and 2+ on the left side.       Dorsalis pedis pulses are 2+ on the right side and 2+ on the left side.     Heart sounds: Normal heart sounds. No murmur.  Pulmonary:     Effort: Pulmonary effort is normal. No respiratory distress.     Breath sounds: Normal breath sounds. No wheezing, rhonchi or rales.  Abdominal:     General: Bowel sounds are normal. There is no distension.     Palpations: Abdomen is soft. Abdomen is not rigid. There is no hepatomegaly or splenomegaly.     Tenderness: There is abdominal tenderness in the right upper quadrant. There is guarding.     Hernia: No hernia is present.  Musculoskeletal: Normal range  of motion.        General: No tenderness or deformity.     Right lower leg: No edema.     Left lower leg: No edema.  Skin:    General: Skin is warm and dry.     Findings: No rash.  Neurological:     Mental Status: She is alert and oriented to person, place, and time.  Psychiatric:        Mood and Affect: Mood normal.        Behavior: Behavior normal.     Results for orders placed or performed during the hospital encounter of 05/11/19 (from the past 48 hour(s))  Lipase, blood     Status: None   Collection Time: 05/11/19  2:05 AM  Result Value Ref Range   Lipase 25 11 - 51 U/L    Comment: Performed at [facility], [address] [place], [place] [address]  Comprehensive metabolic panel     Status: Abnormal   Collection Time: 05/11/19  2:05 AM  Result Value Ref Range   Sodium 137 135 - 145 mmol/L   Potassium 4.4 3.5 - 5.1 mmol/L   Chloride 101 98 - 111 mmol/L   CO2 25 22 - 32 mmol/L   Glucose, Bld 135 (H) 70 - 99 mg/dL   BUN 9 6 - 20 mg/dL   Creatinine, Ser 6.040.80 0.44 - 1.00 mg/dL   Calcium 9.6 8.9 - 54.010.3 mg/dL   Total Protein 7.8 6.5 - 8.1 g/dL   Albumin 4.1 3.5 - 5.0 g/dL   AST 23 15 - 41 U/L   ALT 22 0 - 44 U/L   Alkaline Phosphatase 77 38 - 126 U/L   Total Bilirubin 0.5 0.3 - 1.2 mg/dL   GFR calc non Af Amer >60 >60 mL/min   GFR calc Af Amer >60 >60 mL/min   Anion gap 11 5 - 15    Comment: Performed at [facility], [address] [place], [place] [address]  CBC     Status: Abnormal   Collection Time: 05/11/19  2:05 AM  Result Value Ref Range   WBC 17.0 (H) 4.0 - 10.5 K/uL   RBC 4.71 3.87 - 5.11 MIL/uL   Hemoglobin 12.7 12.0 - 15.0 g/dL   HCT 14.739.4 82.936.0 - 56.246.0 %   MCV 83.7 80.0 - 100.0 fL   MCH 27.0 26.0 - 34.0 pg   MCHC 32.2 30.0 - 36.0 g/dL   RDW 13.014.5 86.511.5 - 78.415.5 %   Platelets 378 150 - 400 K/uL   nRBC 0.0 0.0 - 0.2 %    Comment: Performed at [facility], [address] [place], [place] [address]  I-Stat beta hCG blood, ED     Status: None   Collection Time: 05/11/19  2:14 AM  Result Value Ref Range   I-stat hCG, quantitative <5.0 <5 mIU/mL   Comment 3            Comment:   GEST. AGE      CONC.  (mIU/mL)   <=1 WEEK        5 - 50     2 WEEKS       50 - 500     3 WEEKS       100 - 10,000     4 WEEKS     1,000 - 30,000        FEMALE AND NON-PREGNANT FEMALE:     LESS THAN  5 mIU/mL   Urinalysis, Routine w reflex microscopic     Status: Abnormal   Collection Time: 05/11/19  2:15 AM  Result Value Ref Range   Color, Urine YELLOW YELLOW   APPearance HAZY (A) CLEAR   Specific Gravity, Urine 1.028 1.005 - 1.030   pH 6.0 5.0 - 8.0   Glucose, UA NEGATIVE NEGATIVE  mg/dL   Hgb urine dipstick NEGATIVE NEGATIVE   Bilirubin Urine NEGATIVE NEGATIVE   Ketones, ur 5 (A) NEGATIVE mg/dL   Protein, ur 30 (A) NEGATIVE mg/dL   Nitrite NEGATIVE NEGATIVE   Leukocytes,Ua NEGATIVE NEGATIVE   RBC / HPF 0-5 0 - 5 RBC/hpf   WBC, UA 0-5 0 - 5 WBC/hpf   Bacteria, UA NONE SEEN NONE SEEN   Squamous Epithelial / LPF 0-5 0 - 5   Mucus PRESENT    Hyaline Casts, UA PRESENT     Comment: Performed at [facility], [address] [place], [place] [address]   [place] Abdomen Limited Ruq  Result Date: 05/11/2019 CLINICAL DATA:  Right upper quadrant pain for 3 weeks EXAM: ULTRASOUND ABDOMEN LIMITED RIGHT UPPER QUADRANT COMPARISON:  None. FINDINGS: Gallbladder: Two shadowing gallstones measuring up to 16 mm. No wall thickening or focal tenderness. Common bile duct: Diameter: 3 mm Liver: No focal lesion identified. Within normal limits in parenchymal echogenicity. Portal vein is patent on color Doppler imaging with normal direction of blood flow towards the liver. IMPRESSION: Cholelithiasis without evidence of cholecystitis. Electronically Signed   By: Marnee SpringJonathon  Watts M.D.   On: 05/11/2019 06:50      Assessment/Plan Active Problems:   * No active hospital problems. *  Symptomatic cholelithiasis, probably cholecystitis with a WBC of 17 - afebrile, labs WNL - RUQ showed 2 stones at 16mm, no cholecystitis  - or today for lap chole with possible discharge from pacu   Jerre SimonJessica L Iolani Twilley, [facility] 05/11/2019, 9:48 AM Pager: [phone] Consults: [phone] Mon-Fri 7:00 am-4:30 pm Sat-Sun 7:00 am-11:30 am

## 2019-05-11 NOTE — ED Notes (Signed)
Pt c/o upper abd pain  For 3 weeks  Worse last pm after she ate fried foods  She has been vomiting since last pm

## 2019-05-11 NOTE — ED Triage Notes (Signed)
Pt c/o RUQ pain x 3 weeks, worse after eating certain foods. Denies nausea/vomiting.

## 2019-05-11 NOTE — Discharge Instructions (Signed)
[facility] ° °Please arrive at least 30 min before your appointment to complete your check in paperwork.  If you are unable to arrive 30 min prior to your appointment time we may have to cancel or reschedule you. °LAPAROSCOPIC SURGERY: POST OP INSTRUCTIONS °Always review your discharge instruction sheet given to you by the facility where your surgery was performed. °IF YOU HAVE DISABILITY OR FAMILY LEAVE FORMS, YOU MUST BRING THEM TO THE OFFICE FOR PROCESSING.   °DO NOT GIVE THEM TO YOUR DOCTOR. ° °PAIN CONTROL ° °1. First take acetaminophen (Tylenol) AND/or ibuprofen (Advil) to control your pain after surgery.  Follow directions on package.  Taking acetaminophen (Tylenol) and/or ibuprofen (Advil) regularly after surgery will help to control your pain and lower the amount of prescription pain medication you may need.  You should not take more than 4,000 mg (4 grams) of acetaminophen (Tylenol) in 24 hours.  You should not take ibuprofen (Advil), aleve, motrin, naprosyn or other NSAIDS if you have a history of stomach ulcers or chronic kidney disease.  °2. A prescription for pain medication may be given to you upon discharge.  Take your pain medication as prescribed, if you still have uncontrolled pain after taking acetaminophen (Tylenol) or ibuprofen (Advil). °3. Use ice packs to help control pain. °4. If you need a refill on your pain medication, please contact your pharmacy.  They will contact our office to request authorization. Prescriptions will not be filled after 5pm or on week-ends. ° °HOME MEDICATIONS °5. Take your usually prescribed medications unless otherwise directed. ° °DIET °6. You should follow a light diet the first few days after arrival home.  Be sure to include lots of fluids daily. Avoid fatty, fried foods.  ° °CONSTIPATION °7. It is common to experience some constipation after surgery and if you are taking pain medication.  Increasing fluid intake and taking a stool  softener (such as Colace) will usually help or prevent this problem from occurring.  A mild laxative (Milk of Magnesia or Miralax) should be taken according to package instructions if there are no bowel movements after 48 hours. ° °WOUND/INCISION CARE °8. Most patients will experience some swelling and bruising in the area of the incisions.  Ice packs will help.  Swelling and bruising can take several days to resolve.  °9. Unless discharge instructions indicate otherwise, follow guidelines below  °a. STERI-STRIPS - you may remove your outer bandages 48 hours after surgery, and you may shower at that time.  You have steri-strips (small skin tapes) in place directly over the incision.  These strips should be left on the skin for 7-10 days.   °b. DERMABOND/SKIN GLUE - you may shower in 24 hours.  The glue will flake off over the next 2-3 weeks. °10. Any sutures or staples will be removed at the office during your follow-up visit. ° °ACTIVITIES °11. You may resume regular (light) daily activities beginning the next day--such as daily self-care, walking, climbing stairs--gradually increasing activities as tolerated.  You may have sexual intercourse when it is comfortable.  Refrain from any heavy lifting or straining until approved by your doctor. °a. You may drive when you are no longer taking prescription pain medication, you can comfortably wear a seatbelt, and you can safely maneuver your car and apply brakes. ° °FOLLOW-UP °12. You should see your doctor in the office for a follow-up appointment approximately 2-3 weeks after your surgery.  You should have been given your post-op/follow-up appointment when   your surgery was scheduled.  If you did not receive a post-op/follow-up appointment, make sure that you call for this appointment within a day or two after you arrive home to insure a convenient appointment time. ° °OTHER INSTRUCTIONS ° °WHEN TO CALL YOUR DOCTOR: °1. Fever over 101.0 °2. Inability to  urinate °3. Continued bleeding from incision. °4. Increased pain, redness, or drainage from the incision. °5. Increasing abdominal pain ° °The clinic staff is available to answer your questions during regular business hours.  Please don’t hesitate to call and ask to speak to one of the nurses for clinical concerns.  If you have a medical emergency, go to the nearest emergency room or call 911.  A surgeon from [facility] is always on call at the hospital. °[address], [address], [place], [place]  [address] ? [address] [address], [place], [place]   [address] °[phone] ? [phone] ? FAX [phone] ° ° ° °

## 2019-05-11 NOTE — ED Provider Notes (Signed)
[facility] EMERGENCY DEPARTMENT Provider Note   CSN: [id number] Arrival date & time: 05/11/19  0144    History   Chief Complaint Chief Complaint  Patient presents with  . Abdominal Pain    HPI Amanda Marshall is a 23 y.o. female with no pertinent past medical history who presents to the emergency department with a chief complaint of abdominal pain.  The patient endorses right upper quadrant pain that has been constant for the last 3 weeks.  She reports the pain has been waxing and waning and is worse after eating.  She reports that the pain significantly worsened tonight after dinner that she developed nausea and NBNB vomiting.  She reports aggravating factors include eating and no known alleviating factors.  She denies fever, chills, back pain, dysuria, hematuria, vaginal pain or discharge, chest pain, shortness of breath.  No treatment prior to arrival.  No history of abdominal surgery.     The history is provided by the patient. No language interpreter was used.    History reviewed. No pertinent past medical history.  There are no active problems to display for this patient.   History reviewed. No pertinent surgical history.   OB History   No obstetric history on file.      Home Medications    Prior to Admission medications   Not on File    Family History Family History  Problem Relation Age of Onset  . Hypertension Mother   . Diabetes Mother     Social History Social History   Tobacco Use  . Smoking status: Never Smoker  . Smokeless tobacco: Never Used  Substance Use Topics  . Alcohol use: Yes  . Drug use: Never     Allergies   Patient has no known allergies.   Review of Systems Review of Systems  Constitutional: Negative for activity change, chills and fever.  Respiratory: Negative for shortness of breath.   Cardiovascular: Negative for chest pain.  Gastrointestinal: Positive for abdominal pain, nausea and vomiting.  Negative for blood in stool, diarrhea and rectal pain.  Genitourinary: Negative for dysuria, flank pain, hematuria, urgency, vaginal discharge and vaginal pain.  Musculoskeletal: Negative for back pain.  Skin: Negative for rash.  Allergic/Immunologic: Negative for immunocompromised state.  Neurological: Negative for seizures, weakness and headaches.  Psychiatric/Behavioral: Negative for confusion.     Physical Exam Updated Vital Signs BP (!) 111/53   Pulse 82   Temp 98 F (36.7 C) (Oral)   Resp 16   SpO2 99%   Physical Exam Vitals signs and nursing note reviewed.  Constitutional:      General: She is not in acute distress. HENT:     Head: Normocephalic.  Eyes:     Conjunctiva/sclera: Conjunctivae normal.  Neck:     Musculoskeletal: Neck supple.  Cardiovascular:     Rate and Rhythm: Normal rate and regular rhythm.     Heart sounds: No murmur. No friction rub. No gallop.   Pulmonary:     Effort: Pulmonary effort is normal. No respiratory distress.     Breath sounds: No stridor. No wheezing, rhonchi or rales.  Chest:     Chest wall: No tenderness.  Abdominal:     General: There is no distension.     Palpations: Abdomen is soft. There is no mass.     Tenderness: There is abdominal tenderness. There is no right CVA tenderness, left CVA tenderness, guarding or rebound.     Hernia: No hernia is present.  Comments: Tender to palpation of the right upper quadrant.  Positive Murphy sign.  No rebound or guarding.  Abdomen is soft, obese, but nondistended.  No tenderness over McBurney's point.  No CVA tenderness bilaterally.  Musculoskeletal:     Right lower leg: No edema.     Left lower leg: No edema.  Skin:    General: Skin is warm.     Findings: No rash.  Neurological:     Mental Status: She is alert.  Psychiatric:        Behavior: Behavior normal.      ED Treatments / Results  Labs (all labs ordered are listed, but only abnormal results are displayed) Labs  Reviewed  COMPREHENSIVE METABOLIC PANEL - Abnormal; Notable for the following components:      Result Value   Glucose, Bld 135 (*)    All other components within normal limits  CBC - Abnormal; Notable for the following components:   WBC 17.0 (*)    All other components within normal limits  URINALYSIS, ROUTINE W REFLEX MICROSCOPIC - Abnormal; Notable for the following components:   APPearance HAZY (*)    Ketones, ur 5 (*)    Protein, ur 30 (*)    All other components within normal limits  LIPASE, BLOOD  I-STAT BETA HCG BLOOD, ED ([facility], [facility], [facility] ONLY)    EKG None  Radiology US Abdomen Limited Ruq  Result Date: 05/11/2019 CLINICAL DATA:  Right upper quadrant pain for 3 weeks EXAM: ULTRASOUND ABDOMEN LIMITED RIGHT UPPER QUADRANT COMPARISON:  None. FINDINGS: Gallbladder: Two shadowing gallstones measuring up to 16 mm. No wall thickening or focal tenderness. Common bile duct: Diameter: 3 mm Liver: No focal lesion identified. Within normal limits in parenchymal echogenicity. Portal vein is patent on color Doppler imaging with normal direction of blood flow towards the liver. IMPRESSION: Cholelithiasis without evidence of cholecystitis. Electronically Signed   By: Monte Fantasia M.D.   On: 05/11/2019 06:50    Procedures Procedures (including critical care time)  Medications Ordered in ED Medications  sodium chloride flush (NS) 0.9 % injection 3 mL (has no administration in time range)  morphine 4 MG/ML injection 4 mg (4 mg Intravenous Given 05/11/19 0613)  ondansetron (ZOFRAN) injection 4 mg (4 mg Intravenous Given 05/11/19 0612)  fentaNYL (SUBLIMAZE) injection 100 mcg (100 mcg Intravenous Given 05/11/19 0734)     Initial Impression / Assessment and Plan / ED Course  I have reviewed the triage vital signs and the nursing notes.  Pertinent labs & imaging results that were available during my care of the patient were reviewed by me and considered in my medical decision making (see chart for  details).  Clinical Course as of May 10 804  Thu May 11, 2019  0715 Patient recheck.  She continues to endorse severe, 9 out of 10 pain.  Given the patient's current blood pressure is 107/97, will order fentanyl and plan for reassessment.  If he is not improved on reassessment, will consider general surgery consult.   [MM]    Clinical Course User Index [MM] Kani Jobson A, PA-C       23 year old female with no pertinent past medical history presenting with waxing and waning, constant right upper quadrant pain for the last 3 weeks.  She developed nausea and vomiting over the last 24 hours.  Afebrile and mildly hypertensive, but vital signs are otherwise unremarkable.  Labs are notable for leukocytosis of 17.  Transaminases are not elevated.  Lipase is normal.  Alk phos is normal.  On exam, she is focally tender in the right upper quadrant and has a positive Murphy sign.  Given concern for acute cholecystitis, right upper quadrant ultrasound was obtained, which demonstrated gallstones, but was negative for acute cholecystitis.  However, on reevaluation after receiving 4 mg of morphine, patient continue to support severe pain.  She does report that her nausea was somewhat improved.  Blood pressure is now 107/97.  Will order fentanyl and plan to reassess.  If she continues to have intractable pain, she will likely need a general surgery consult.  If pain is significantly improved, if she is able to be successfully fluid challenge then she can be discharged home with pain medication and general surgery follow-up.  Patient care transferred to [facility] at the end of my shift. Patient presentation, ED course, and plan of care discussed with review of all pertinent labs and imaging. Please see his/her note for further details regarding further ED course and disposition.  Final Clinical Impressions(s) / ED Diagnoses   Final diagnoses:  RUQ pain    ED Discharge Orders    None       Joanne Gavel, PA-C 05/11/19 0805    Ripley Fraise, MD 05/11/19 2310

## 2019-05-11 NOTE — Transfer of Care (Signed)
Immediate Anesthesia Transfer of Care Note  Patient: Amanda Marshall  Procedure(s) Performed: LAPAROSCOPIC CHOLECYSTECTOMY (N/A Abdomen)  Patient Location: PACU  Anesthesia Type:General  Level of Consciousness: lethargic and responds to stimulation  Airway & Oxygen Therapy: Patient Spontanous Breathing and Patient connected to nasal cannula oxygen  Post-op Assessment: Report given to RN  Post vital signs: Reviewed and stable  Last Vitals:  Vitals Value Taken Time  BP 166/85 05/11/19 1527  Temp    Pulse 94 05/11/19 1533  Resp 20 05/11/19 1533  SpO2 91 % 05/11/19 1533  Vitals shown include unvalidated device data.  Last Pain:  Vitals:   05/11/19 1040  TempSrc:   PainSc: 3          Complications: No apparent anesthesia complications

## 2019-05-11 NOTE — Progress Notes (Signed)
Received patient from PACU. Patient alert and oriented. Dressing clean, dry, and intact. Patient oriented to call bell and bed controls. Instructed patient not to self ambulate and to utilize call bell for assistance. Will continue to monitor.  

## 2019-05-11 NOTE — ED Notes (Signed)
Report given to nurse in green zone.  Dr. Donne Hazel called and pt will go to the OR within the hour.  Pt sleeping quitely.  Updated on POC, no outward pain responses.

## 2019-05-11 NOTE — ED Notes (Signed)
PT ambulated to the bathroom and returned to bed unassisted.

## 2019-05-11 NOTE — ED Notes (Signed)
Pt returned from ultrasound

## 2019-05-11 NOTE — Anesthesia Postprocedure Evaluation (Signed)
Anesthesia Post Note  Patient: Amanda Marshall  Procedure(s) Performed: LAPAROSCOPIC CHOLECYSTECTOMY (N/A Abdomen)     Patient location during evaluation: PACU Anesthesia Type: General Level of consciousness: awake Pain management: pain level controlled Vital Signs Assessment: post-procedure vital signs reviewed and stable Respiratory status: spontaneous breathing Cardiovascular status: stable Postop Assessment: no apparent nausea or vomiting Anesthetic complications: no    Last Vitals:  Vitals:   05/11/19 1000 05/11/19 1100  BP: 121/74 120/76  Pulse: 83 69  Resp:    Temp:    SpO2: 100% 98%    Last Pain:  Vitals:   05/11/19 1040  TempSrc:   PainSc: 3                  [name]

## 2019-05-11 NOTE — Anesthesia Procedure Notes (Signed)
Procedure Name: Intubation Date/Time: 05/11/2019 2:13 PM Performed by: Barrington Ellison, CRNA Pre-anesthesia Checklist: Patient identified, Emergency Drugs available, Suction available and Patient being monitored Patient Re-evaluated:Patient Re-evaluated prior to induction Oxygen Delivery Method: Circle System Utilized Preoxygenation: Pre-oxygenation with 100% oxygen Induction Type: IV induction, Rapid sequence and Cricoid Pressure applied Laryngoscope Size: Mac and 3 Grade View: Grade I Tube type: Oral Tube size: 7.0 mm Number of attempts: 1 Airway Equipment and Method: Stylet and Oral airway Placement Confirmation: ETT inserted through vocal cords under direct vision,  positive ETCO2 and breath sounds checked- equal and bilateral Secured at: 21 cm Tube secured with: Tape Dental Injury: Teeth and Oropharynx as per pre-operative assessment

## 2019-05-12 ENCOUNTER — Encounter (HOSPITAL_COMMUNITY): Payer: Self-pay | Admitting: General Surgery

## 2019-05-12 MED ORDER — OXYCODONE HCL 5 MG PO TABS: 5.0000 mg | ORAL_TABLET | Freq: Four times a day (QID) | ORAL | 0 refills | Status: AC | PRN

## 2019-05-12 NOTE — Discharge Summary (Signed)
Physician Discharge Summary  Patient ID: Amanda Marshall MRN: [id number] DOB/AGE: 25/05/1996 25 y.o.  Admit date: 05/11/2019 Discharge date: 05/12/2019  Admission Diagnoses: Acute cholecystitis  Discharge Diagnoses:  Active Problems:   Cholecystitis   Discharged Condition: good  Hospital Course: 67 yof admitted with ruq pain, elevated wbc and cholelithiasis on [place].  She underwent laparoscopic cholecystectomy without difficulty. Doing well following day and will be discharged home  Consults: None  Significant Diagnostic Studies: [place]  Treatments: surgery: lap chole  Discharge Exam: Blood pressure 110/60, pulse 72, temperature 97.8 F (36.6 C), temperature source Oral, resp. rate 18, height 5\' 2"  (1.575 m), weight 104.7 kg, SpO2 98 %. GI: soft approp tender incisions clean  Disposition: Discharge disposition: 01-Home or Self Care        Allergies as of 05/12/2019   No Known Allergies     Medication List    TAKE these medications   oxyCODONE 5 MG immediate release tablet Commonly known as: Oxy IR/ROXICODONE Take 1 tablet (5 mg total) by mouth every 6 (six) hours as needed for moderate pain.      Follow-up [facility], [place]. Go on 05/25/2019.   Specialty: General Surgery Why: Your appointment is 08/27 at 2pm Please arrive 30 minutes prior to your appointment to check in and fill out paperwork. Bring photo ID and insurance information. Contact information: [address] [place] [phone]          Signed: Rolm Bookbinder 05/12/2019, 8:21 AM

## 2019-05-12 NOTE — Progress Notes (Signed)
Patient discharged to home with instructions. 

## 2019-08-22 ENCOUNTER — Other Ambulatory Visit: Payer: Self-pay

## 2019-08-22 ENCOUNTER — Encounter: Payer: Self-pay | Admitting: Family Medicine

## 2019-08-22 ENCOUNTER — Ambulatory Visit (INDEPENDENT_AMBULATORY_CARE_PROVIDER_SITE_OTHER): Payer: Self-pay | Admitting: Family Medicine

## 2019-08-22 VITALS — BP 140/89 | HR 102 | Wt 230.0 lb

## 2019-08-22 DIAGNOSIS — G43109 Migraine with aura, not intractable, without status migrainosus: Secondary | ICD-10-CM

## 2019-08-22 DIAGNOSIS — E282 Polycystic ovarian syndrome: Secondary | ICD-10-CM

## 2019-08-22 LAB — POCT PREGNANCY, URINE: Preg Test, Ur: NEGATIVE

## 2019-08-22 MED ORDER — NORETHINDRONE 0.35 MG PO TABS: 1.0000 | ORAL_TABLET | Freq: Every day | ORAL | 11 refills | Status: AC

## 2019-08-22 NOTE — Progress Notes (Signed)
GYNECOLOGY OFFICE VISIT NOTE  History:   Amanda Marshall is a 23 y.o. No obstetric history on file. here today for concerns regarding PCOS and fertility.  Has been trying to conceive for two years but has been unsuccessful Over the past two years has had abnormal periods Cycles anywhere from 28-38 days long, sometimes does not get period for two months Periods last for about 2 weeks Using ovulation strips, has never had one that has not been positive In same sex relationship Using donated sperm but not tracking cycle/fertility window, just using ovulation strip intermittently and then asking friend to donate Denies breast tenderness or other moliminal symptoms with cycle  PCOS and fibroids run in her family Mother and aunts have fibroids Sister and cousins have PCOS Has noticed some hair on upper lip but does not remove it Has noticed some chin hair new as of last year   Past Medical History:  Diagnosis Date  . Medical history non-contributory     Past Surgical History:  Procedure Laterality Date  . CHOLECYSTECTOMY N/A 05/11/2019   Procedure: LAPAROSCOPIC CHOLECYSTECTOMY;  Surgeon: Emelia Loron, MD;  Location: [facility] OR;  Service: General;  Laterality: N/A;    The following portions of the patient's history were reviewed and updated as appropriate: allergies, current medications, past family history, past medical history, past social history, past surgical history and problem list.   Health Maintenance:  No pap on file. Not due for mammogram.   Review of Systems:  Pertinent items noted in HPI and remainder of comprehensive ROS otherwise negative.  Physical Exam:  BP 140/89   Pulse (!) 102   Wt 230 lb (104.3 kg)   BMI 42.07 kg/m  CONSTITUTIONAL: Well-developed, well-nourished female in no acute distress.  HEENT:  Normocephalic, atraumatic. External right and left ear normal. No scleral icterus.  NECK: Normal range of motion, supple, no masses noted on observation  SKIN: No rash noted. Not diaphoretic. No erythema. No pallor. MUSCULOSKELETAL: Normal range of motion. No edema noted. NEUROLOGIC: Alert and oriented to person, place, and time. Normal muscle tone coordination. PSYCHIATRIC: Normal mood and affect. Normal behavior. Normal judgment and thought content. RESPIRATORY: Effort normal, no problems with respiration noted  PELVIC: Deferred  Labs and Imaging Results for orders placed or performed in visit on 08/22/19 (from the past 168 hour(s))  Pregnancy, urine POC   Collection Time: 08/22/19  2:55 PM  Result Value Ref Range   Preg Test, Ur NEGATIVE NEGATIVE   No results found.    Assessment and Plan:   Problem List Items Addressed This Visit      Cardiovascular and Mediastinum   Migraine headache with aura     Endocrine   PCOS (polycystic ovarian syndrome) - Primary    Patient meets criteria for PCOS with oligo and hisutism. Currently desires pregnancy but is likely anovulatory due to PCOS/obesity, suspect positive ovulation tests are due to high LH levels in setting of PCOS. Had in depth conversation with patient regarding further lab and imaging workup for PCOS, however currently no insurance so decision made to defer this given she already meets clinical criteria. With regards to fertility, discussed in depth that it would likely require medication (difficult w/o insurance) and lifestyle changes. Fibroids may be playing a role as well given prolonged menses, but this workup also deferred for above reasons.   Given that patient may be able to achieve fertility/ovulation with weight loss and this would also give better chance for a healthy pregnancy engaged  in shared decision making and plan for weight loss/dietary changes/exercise with follow up in 3 months to assess progress. Also discussed endometrial protection in meantime given PCOS/likely anovulatory cycles and she is amenable to POPs (OCP contraindicated as patient gave hx of migraine w aura  present for many years). UPT negative at our visit however afterwards unclear when last use of donor sperm was. Attempted to call patient on multiple occasions to clarify and see if patient needs to return at 2wk for repeat UPT but unable to reach, will start my chart message for outreach.      Relevant Medications   norethindrone (MICRONOR) 0.35 MG tablet   Other Relevant Orders   Pregnancy, urine POC (Completed)      Return in about 3 months (around 11/22/2019) for follow up PCOS.    Total face-to-face time with patient: 20 minutes.  Over 50% of encounter was spent on counseling and coordination of care.   Augustin Coupe, [place] for [employer], [place]

## 2019-08-22 NOTE — Progress Notes (Signed)
Irregular cycles skipped last months cycle

## 2019-08-22 NOTE — Assessment & Plan Note (Addendum)
Patient meets criteria for PCOS with oligo and hisutism. Currently desires pregnancy but is likely anovulatory due to PCOS/obesity, suspect positive ovulation tests are due to high LH levels in setting of PCOS. Had in depth conversation with patient regarding further lab and imaging workup for PCOS, however currently no insurance so decision made to defer this given she already meets clinical criteria. With regards to fertility, discussed in depth that it would likely require medication (difficult w/o insurance) and lifestyle changes. Fibroids may be playing a role as well given prolonged menses, but this workup also deferred for above reasons.   Given that patient may be able to achieve fertility/ovulation with weight loss and this would also give better chance for a healthy pregnancy engaged in shared decision making and plan for weight loss/dietary changes/exercise with follow up in 3 months to assess progress. Also discussed endometrial protection in meantime given PCOS/likely anovulatory cycles and she is amenable to POPs (OCP contraindicated as patient gave hx of migraine w aura present for many years). UPT negative at our visit however afterwards unclear when last use of donor sperm was. Attempted to call patient on multiple occasions to clarify and see if patient needs to return at 2wk for repeat UPT but unable to reach, will start my chart message for outreach.

## 2019-08-22 NOTE — Patient Instructions (Signed)
Exercising to Lose Weight Exercise is structured, repetitive physical activity to improve fitness and health. Getting regular exercise is important for everyone. It is especially important if you are overweight. Being overweight increases your risk of heart disease, stroke, diabetes, high blood pressure, and several types of cancer. Reducing your calorie intake and exercising can help you lose weight. Exercise is usually categorized as moderate or vigorous intensity. To lose weight, most people need to do a certain amount of moderate-intensity or vigorous-intensity exercise each week. Moderate-intensity exercise  Moderate-intensity exercise is any activity that gets you moving enough to burn at least three times more energy (calories) than if you were sitting. Examples of moderate exercise include:  Walking a mile in 15 minutes.  Doing light yard work.  Biking at an easy pace. Most people should get at least 150 minutes (2 hours and 30 minutes) a week of moderate-intensity exercise to maintain their body weight. Vigorous-intensity exercise Vigorous-intensity exercise is any activity that gets you moving enough to burn at least six times more calories than if you were sitting. When you exercise at this intensity, you should be working hard enough that you are not able to carry on a conversation. Examples of vigorous exercise include:  Running.  Playing a team sport, such as football, basketball, and soccer.  Jumping rope. Most people should get at least 75 minutes (1 hour and 15 minutes) a week of vigorous-intensity exercise to maintain their body weight. How can exercise affect me? When you exercise enough to burn more calories than you eat, you lose weight. Exercise also reduces body fat and builds muscle. The more muscle you have, the more calories you burn. Exercise also:  Improves mood.  Reduces stress and tension.  Improves your overall fitness, flexibility, and endurance.   Increases bone strength. The amount of exercise you need to lose weight depends on:  Your age.  The type of exercise.  Any health conditions you have.  Your overall physical ability. Talk to your health care provider about how much exercise you need and what types of activities are safe for you. What actions can I take to lose weight? Nutrition   Make changes to your diet as told by your health care provider or diet and nutrition specialist (dietitian). This may include: ? Eating fewer calories. ? Eating more protein. ? Eating less unhealthy fats. ? Eating a diet that includes fresh fruits and vegetables, whole grains, low-fat dairy products, and lean protein. ? Avoiding foods with added fat, salt, and sugar.  Drink plenty of water while you exercise to prevent dehydration or heat stroke. Activity  Choose an activity that you enjoy and set realistic goals. Your health care provider can help you make an exercise plan that works for you.  Exercise at a moderate or vigorous intensity most days of the week. ? The intensity of exercise may vary from person to person. You can tell how intense a workout is for you by paying attention to your breathing and heartbeat. Most people will notice their breathing and heartbeat get faster with more intense exercise.  Do resistance training twice each week, such as: ? Push-ups. ? Sit-ups. ? Lifting weights. ? Using resistance bands.  Getting short amounts of exercise can be just as helpful as long structured periods of exercise. If you have trouble finding time to exercise, try to include exercise in your daily routine. ? Get up, stretch, and walk around every 30 minutes throughout the day. ? Go for a   walk during your lunch break. ? Park your car farther away from your destination. ? If you take public transportation, get off one stop early and walk the rest of the way. ? Make phone calls while standing up and walking around. ? Take the  stairs instead of elevators or escalators.  Wear comfortable clothes and shoes with good support.  Do not exercise so much that you hurt yourself, feel dizzy, or get very short of breath. Where to find more information  [place] Department of Health and Human Services: www.hhs.gov  Centers for Disease Control and Prevention (CDC): www.cdc.gov Contact a health care provider:  Before starting a new exercise program.  If you have questions or concerns about your weight.  If you have a medical problem that keeps you from exercising. Get help right away if you have any of the following while exercising:  Injury.  Dizziness.  Difficulty breathing or shortness of breath that does not go away when you stop exercising.  Chest pain.  Rapid heartbeat. Summary  Being overweight increases your risk of heart disease, stroke, diabetes, high blood pressure, and several types of cancer.  Losing weight happens when you burn more calories than you eat.  Reducing the amount of calories you eat in addition to getting regular moderate or vigorous exercise each week helps you lose weight. This information is not intended to replace advice given to you by your health care provider. Make sure you discuss any questions you have with your health care provider. Document Released: 10/17/2010 Document Revised: 09/27/2017 Document Reviewed: 09/27/2017 Elsevier Patient Education  2020 Elsevier Inc. Diet for Polycystic Ovary Syndrome Polycystic ovary syndrome (PCOS) is a disorder of the chemicals (hormones) that regulate a woman's reproductive system, including monthly periods (menstruation). The condition causes important hormones to be out of balance. PCOS can:  Stop your periods or make them irregular.  Cause cysts to develop on your ovaries.  Make it difficult to get pregnant.  Stop your body from responding to the effects of insulin (insulin resistance). Insulin resistance can lead to obesity and  diabetes. Changing what you eat can help you manage PCOS and improve your health. Following a balanced diet can help you lose weight and improve the way that your body uses insulin. What are tips for following this plan?  Follow a balanced diet for meals and snacks. Eat breakfast, lunch, dinner, and one or two snacks every day.  Include protein in each meal and snack.  Choose whole grains instead of products that are made with refined flour.  Eat a variety of foods.  Exercise regularly as told by your health care provider. Aim to do 30 or more minutes of exercise on most days of the week.  If you are overweight or obese: ? Pay attention to how many calories you eat. Cutting down on calories can help you lose weight. ? Work with your health care provider or a diet and nutrition specialist (dietitian) to figure out how many calories you need each day. What foods can I eat?  Fruits Include a variety of colors and types. All fruits are helpful for PCOS. Vegetables Include a variety of colors and types. All vegetables are helpful for PCOS. Grains Whole grains, such as whole wheat. Whole-grain breads, crackers, cereals, and pasta. Unsweetened oatmeal, bulgur, barley, quinoa, and brown rice. Tortillas made from corn or whole-wheat flour. Meats and other proteins Low-fat (lean) proteins, such as fish, chicken, beans, eggs, and tofu. Dairy Low-fat dairy products, such as   skim milk, cheese sticks, and yogurt. Beverages Low-fat or fat-free drinks, such as water, low-fat milk, sugar-free drinks, and small amounts of 100% fruit juice. Seasonings and condiments Ketchup. Mustard. Barbecue sauce. Relish. Low-fat or fat-free mayonnaise. Fats and oils Olive oil or canola oil. Walnuts and almonds. The items listed above may not be a complete list of recommended foods and beverages. Contact a dietitian for more options. What foods are not recommended? Foods that are high in calories or fat. Fried  foods. Sweets. Products that are made from refined white flour, including white bread, pastries, white rice, and pasta. The items listed above may not be a complete list of foods and beverages to avoid. Contact a dietitian for more information. Summary  PCOS is a hormonal imbalance that affects a woman's reproductive system.  You can help to manage your PCOS by exercising regularly and eating a healthy, varied diet of vegetables, fruit, whole grains, low-fat (lean) protein, and low-fat dairy products.  Changing what you eat can improve the way that your body uses insulin, help your hormones reach normal levels, and help you lose weight. This information is not intended to replace advice given to you by your health care provider. Make sure you discuss any questions you have with your health care provider. Document Released: 01/06/2016 Document Revised: 01/04/2019 Document Reviewed: 07/19/2017 Elsevier Patient Education  2020 Elsevier Inc. Polycystic Ovarian Syndrome  Polycystic ovarian syndrome (PCOS) is a common hormonal disorder among women of reproductive age. In most women with PCOS, many small fluid-filled sacs (cysts) grow on the ovaries, and the cysts are not part of a normal menstrual cycle. PCOS can cause problems with your menstrual periods and make it difficult to get pregnant. It can also cause an increased risk of miscarriage with pregnancy. If it is not treated, PCOS can lead to serious health problems, such as diabetes and heart disease. What are the causes? The cause of PCOS is not known, but it may be the result of a combination of certain factors, such as:  Irregular menstrual cycle.  High levels of certain hormones (androgens).  Problems with the hormone that helps to control blood sugar (insulin resistance).  Certain genes. What increases the risk? This condition is more likely to develop in women who have a family history of PCOS. What are the signs or symptoms?  Symptoms of PCOS may include:  Multiple ovarian cysts.  Infrequent periods or no periods.  Periods that are too frequent or too heavy.  Unpredictable periods.  Inability to get pregnant (infertility) because of not ovulating.  Increased growth of hair on the face, chest, stomach, back, thumbs, thighs, or toes.  Acne or oily skin. Acne may develop during adulthood, and it may not respond to treatment.  Pelvic pain.  Weight gain or obesity.  Patches of thickened and dark brown or black skin on the neck, arms, breasts, or thighs (acanthosis nigricans).  Excess hair growth on the face, chest, abdomen, or upper thighs (hirsutism). How is this diagnosed? This condition is diagnosed based on:  Your medical history.  A physical exam, including a pelvic exam. Your health care provider may look for areas of increased hair growth on your skin.  Tests, such as: ? Ultrasound. This may be used to examine the ovaries and the lining of the uterus (endometrium) for cysts. ? Blood tests. These may be used to check levels of sugar (glucose), female hormone (testosterone), and female hormones (estrogen and progesterone) in your blood. How is this treated?   There is no cure for PCOS, but treatment can help to manage symptoms and prevent more health problems from developing. Treatment varies depending on:  Your symptoms.  Whether you want to have a baby or whether you need birth control (contraception). Treatment may include nutrition and lifestyle changes along with:  Progesterone hormone to start a menstrual period.  Birth control pills to help you have regular menstrual periods.  Medicines to make you ovulate, if you want to get pregnant.  Medicine to reduce excessive hair growth.  Surgery, in severe cases. This may involve making small holes in one or both of your ovaries. This decreases the amount of testosterone that your body produces. Follow these instructions at home:  Take  over-the-counter and prescription medicines only as told by your health care provider.  Follow a healthy meal plan. This can help you reduce the effects of PCOS. ? Eat a healthy diet that includes lean proteins, complex carbohydrates, fresh fruits and vegetables, low-fat dairy products, and healthy fats. Make sure to eat enough fiber.  If you are overweight, lose weight as told by your health care provider. ? Losing 10% of your body weight may improve symptoms. ? Your health care provider can determine how much weight loss is best for you and can help you lose weight safely.  Keep all follow-up visits as told by your health care provider. This is important. Contact a health care provider if:  Your symptoms do not get better with medicine.  You develop new symptoms. This information is not intended to replace advice given to you by your health care provider. Make sure you discuss any questions you have with your health care provider. Document Released: 01/08/2005 Document Revised: 08/27/2017 Document Reviewed: 03/01/2016 Elsevier Patient Education  2020 Elsevier Inc.  

## 2019-12-15 ENCOUNTER — Ambulatory Visit: Payer: Medicaid Other | Admitting: Family Medicine

## 2020-05-06 IMAGING — DX DG SHOULDER 2+V*L*
4 series · 4 of 4 positions shown · non-contrast
Comparison: None.

CLINICAL DATA: Chronic left shoulder pain for several years
worsening recently.

EXAM:
LEFT SHOULDER - 2+ VIEW

[shoulder neutral ap]
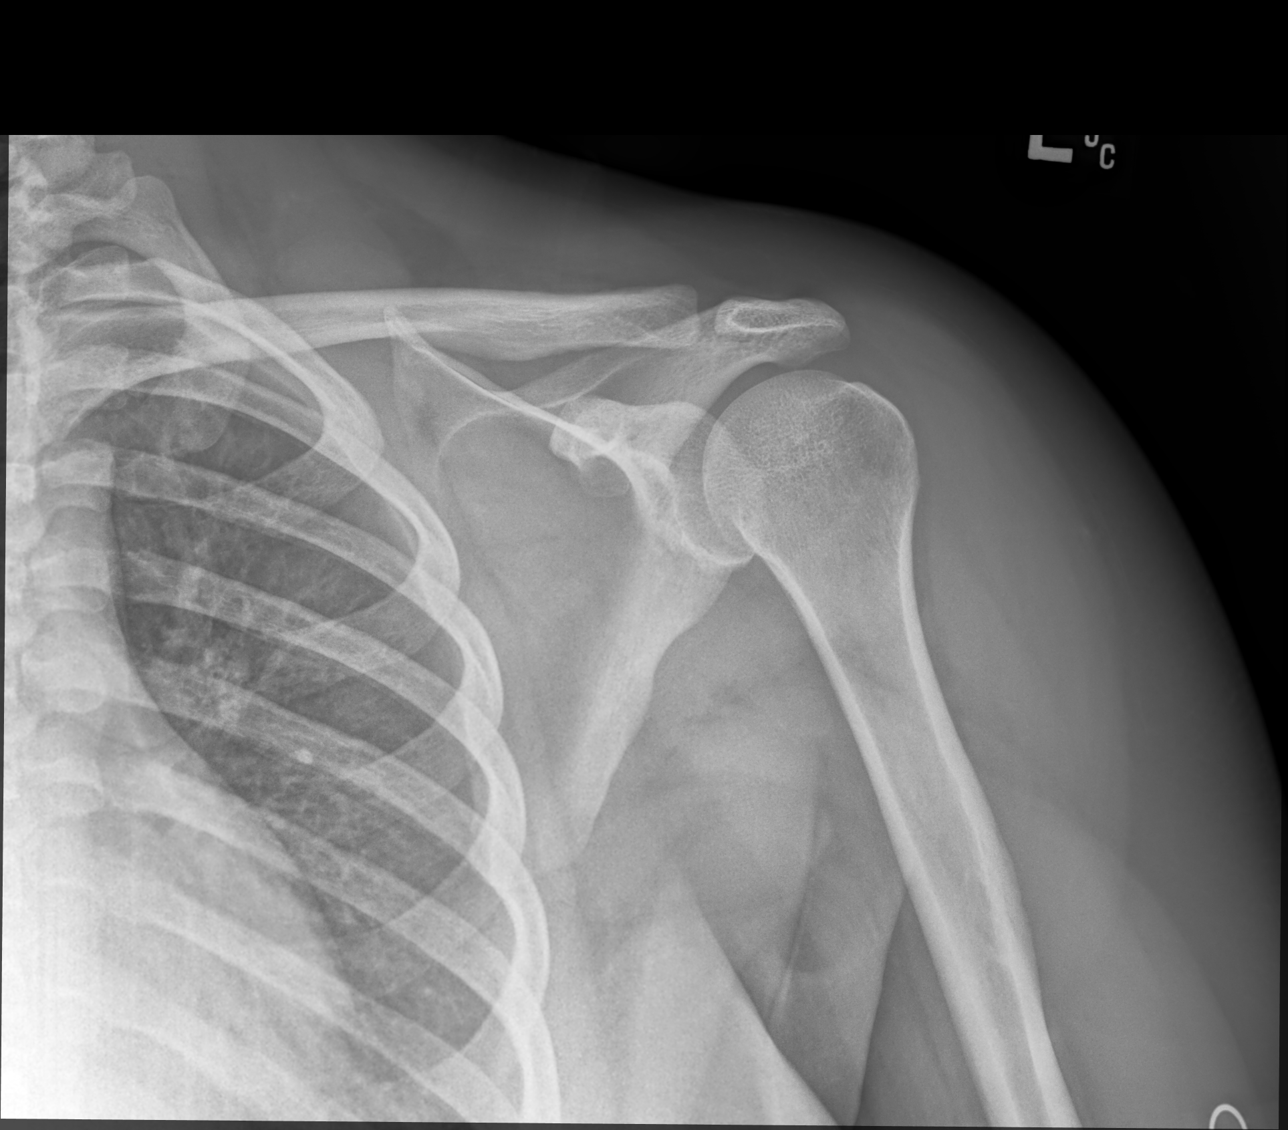

[shoulder internal rotation ap]
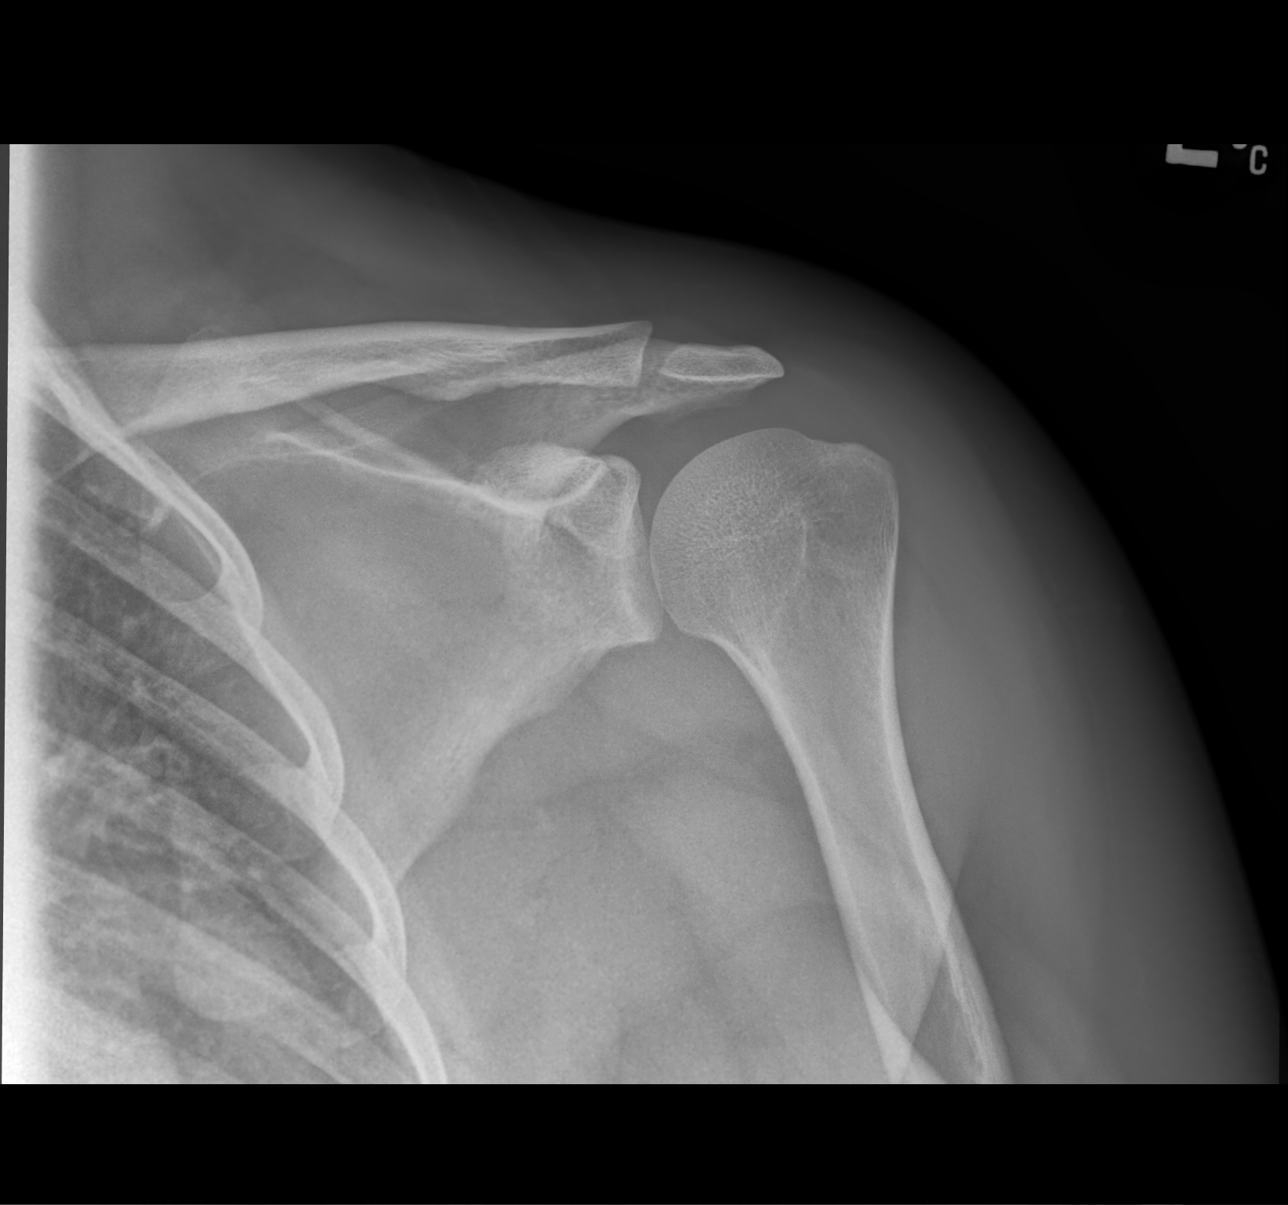

[shoulder transscapular y view (neer)]
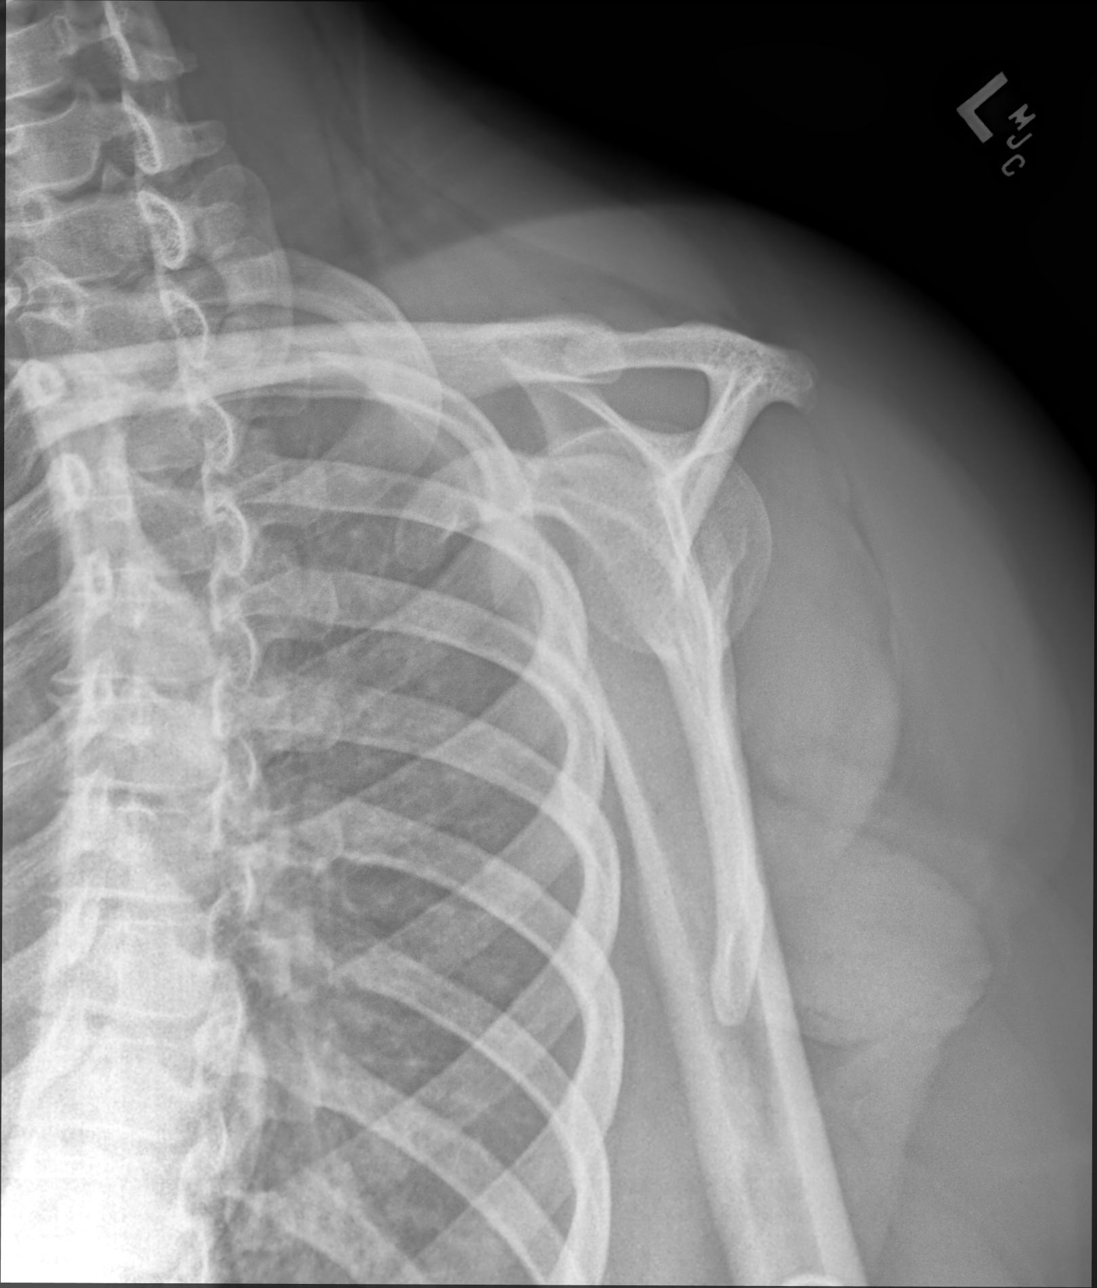

[shoulder axial 5° seated]
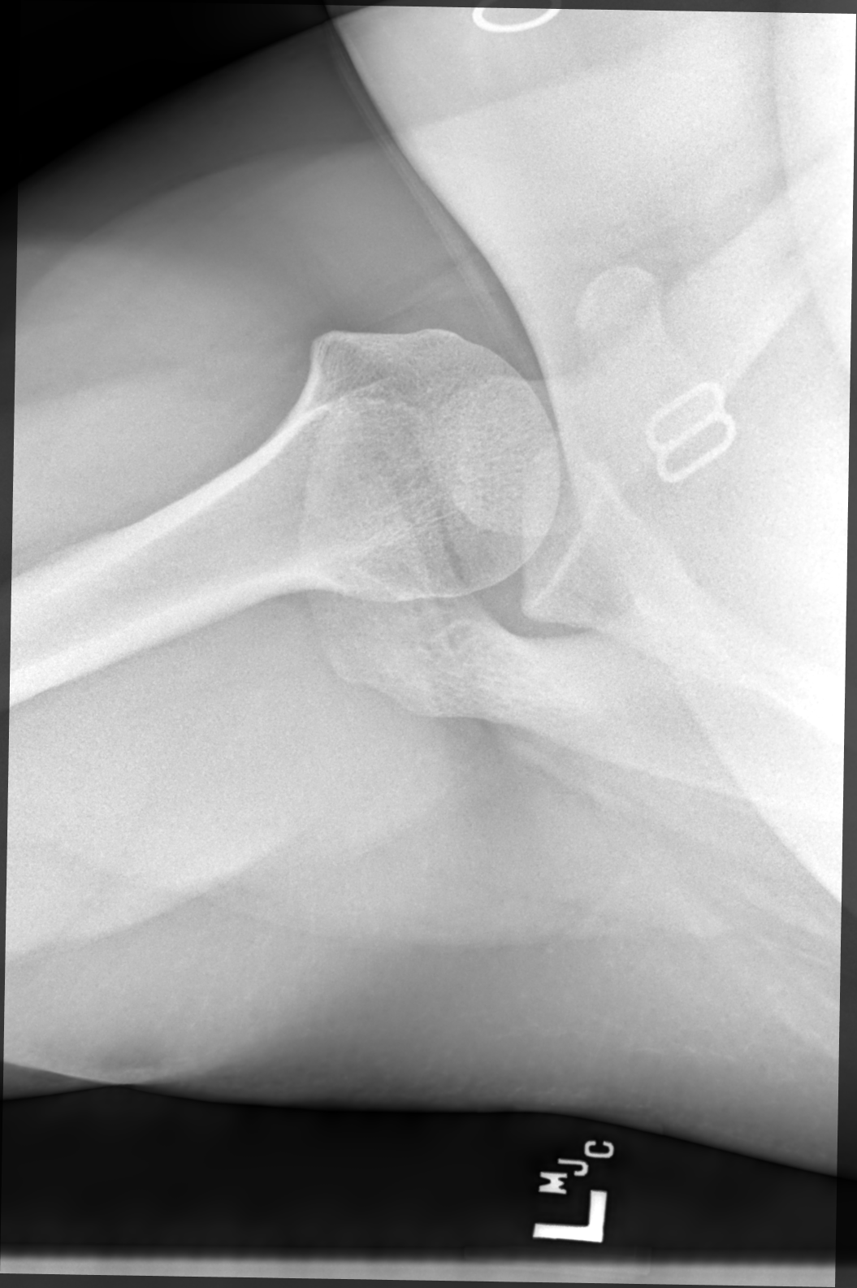

[4 of 4 positions shown; findings below may reference images not displayed]

FINDINGS: There is no evidence of acute fracture or dislocation. There is no
evidence of arthropathy or other focal bone abnormality. Soft
tissues are unremarkable.
IMPRESSION: No acute osseous abnormality of the left shoulder. Intact AC and
glenohumeral joints.

## 2020-12-21 IMAGING — US ULTRASOUND ABDOMEN LIMITED
1 series · 14 of 25 positions shown · non-contrast
Comparison: None.

CLINICAL DATA: Right upper quadrant pain for 3 weeks

EXAM:
ULTRASOUND ABDOMEN LIMITED RIGHT UPPER QUADRANT

[Series 1: ultrasound abdomen limited · 14 of 47 slices shown]
[im 1/47]
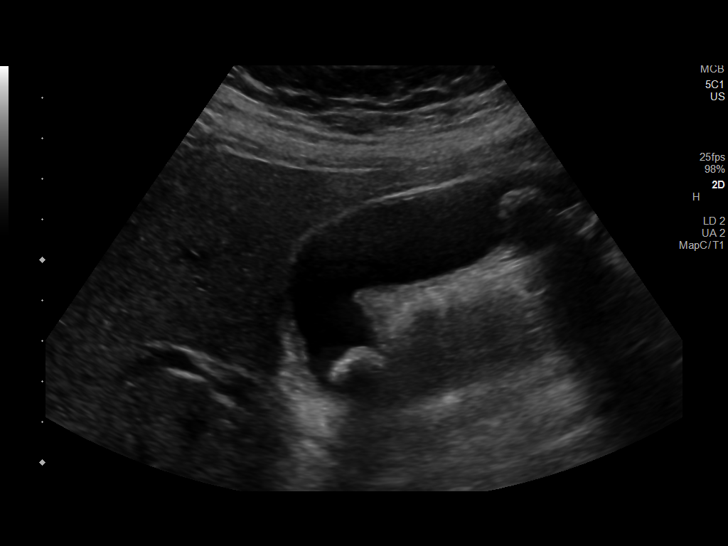
[im 4/47]
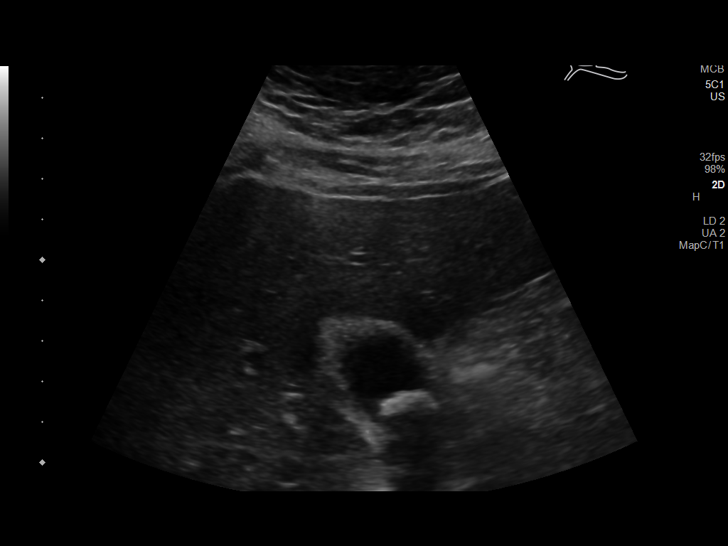
[im 8/47]
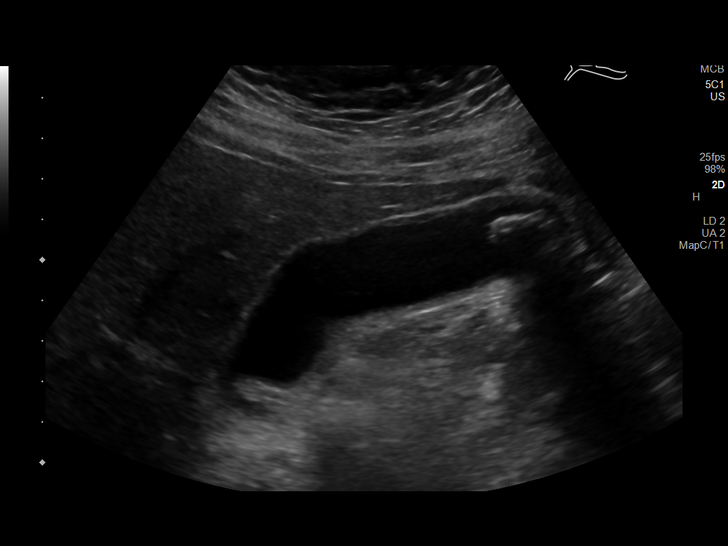
[im 12/47]
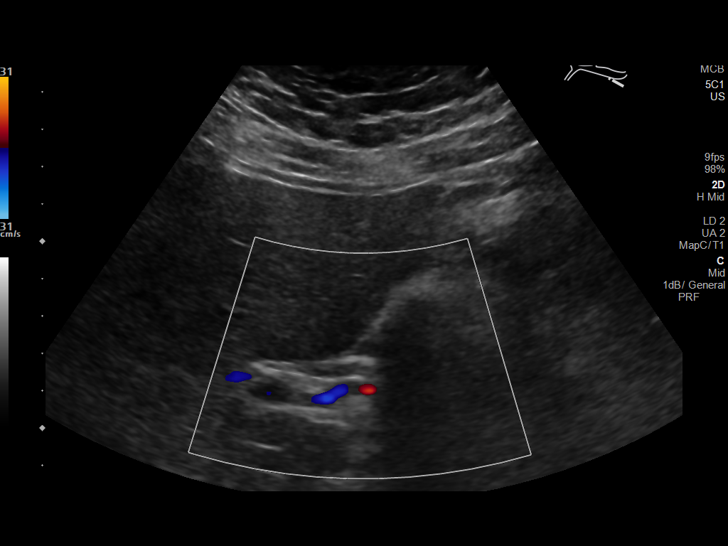
[im 16/47]
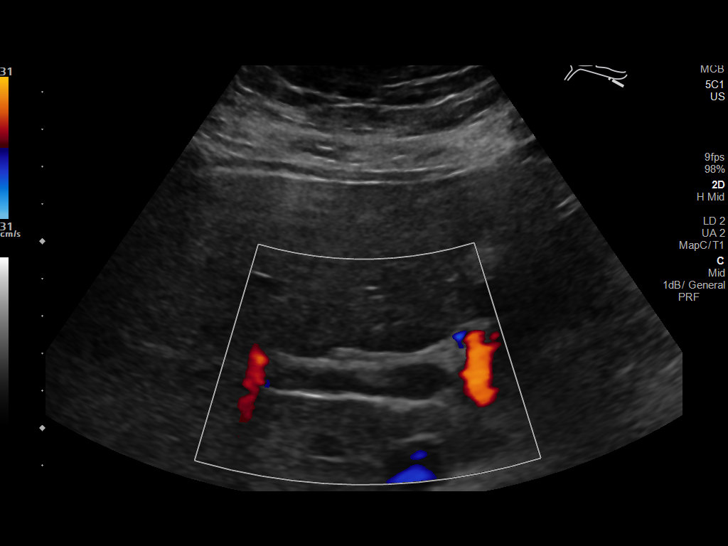
[im 18/47]
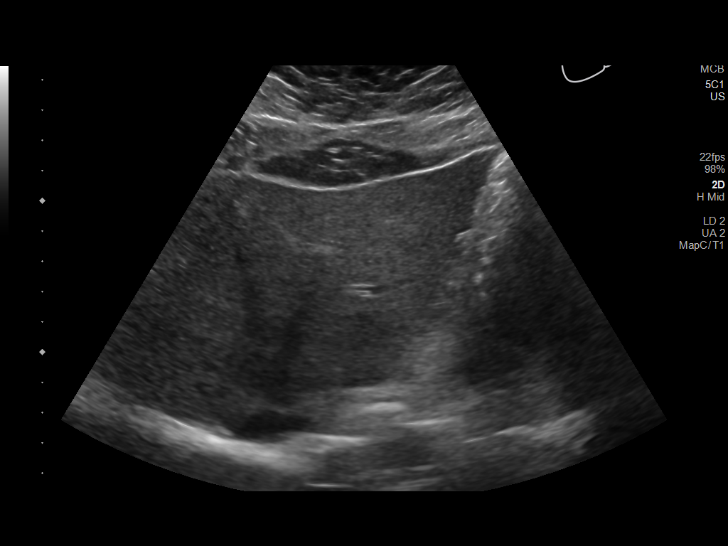
[im 22/47]
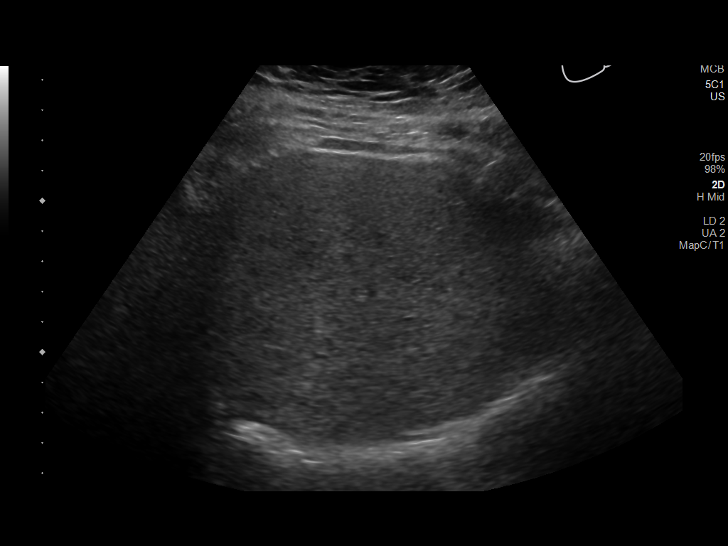
[im 25/47]
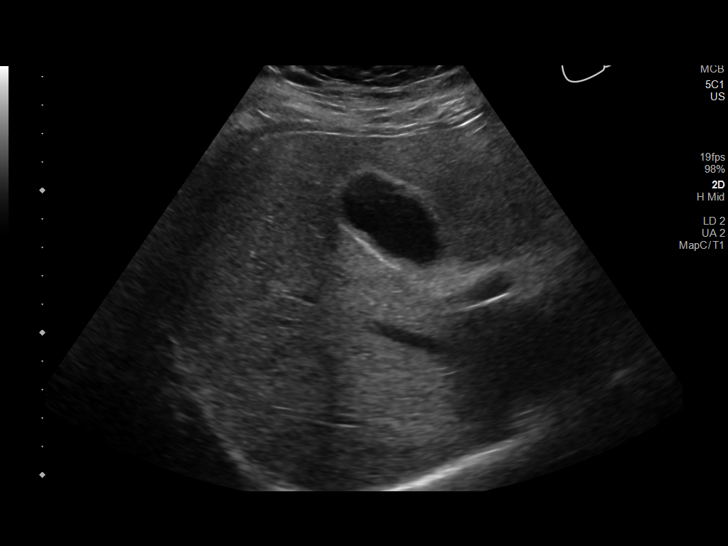
[im 29/47]
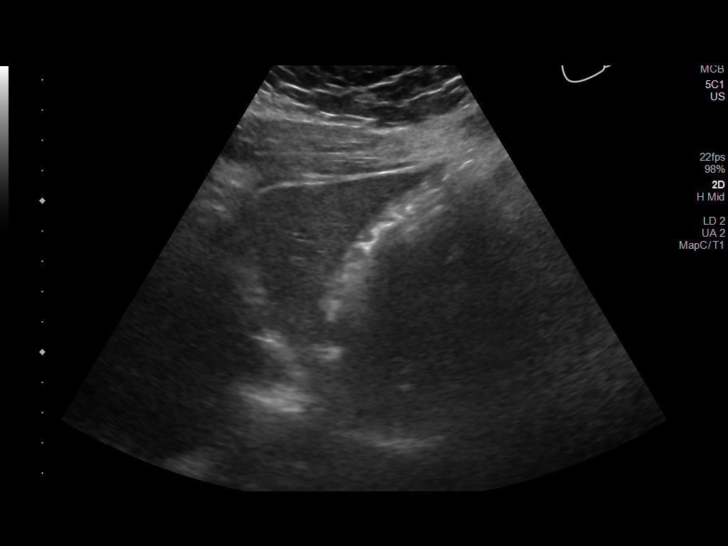
[im 31/47]
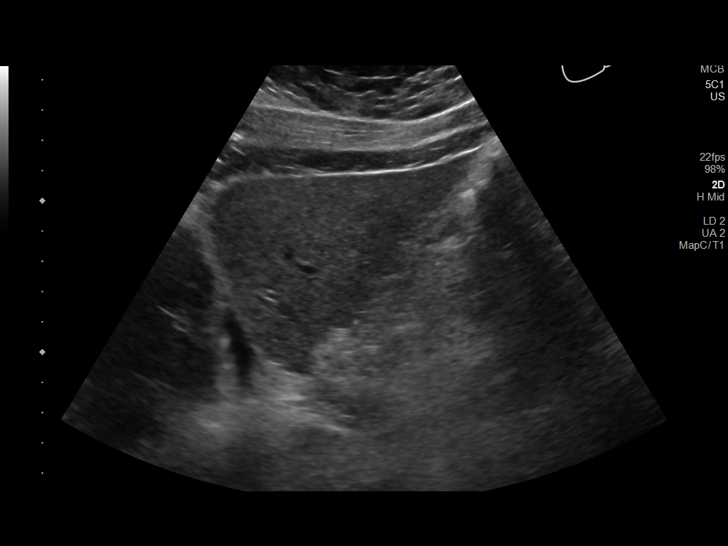
[im 35/47]
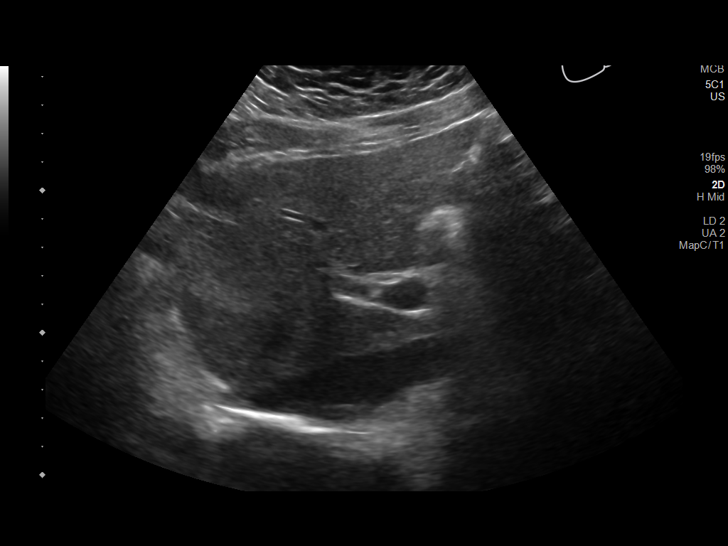
[im 39/47]
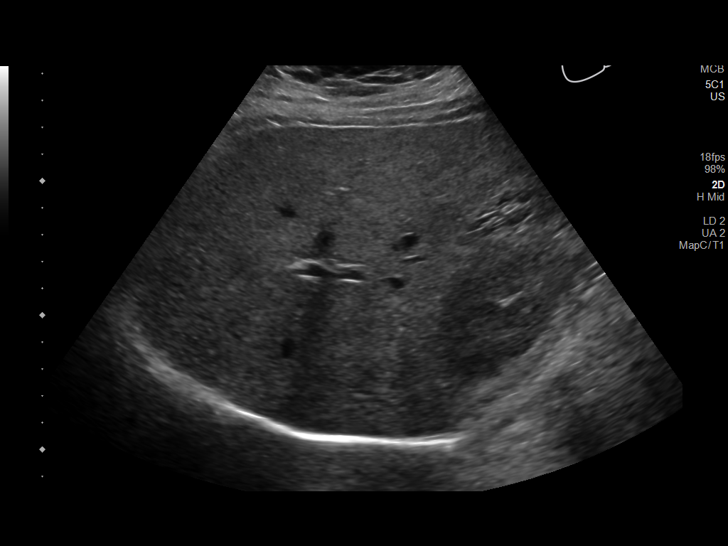
[im 43/47]
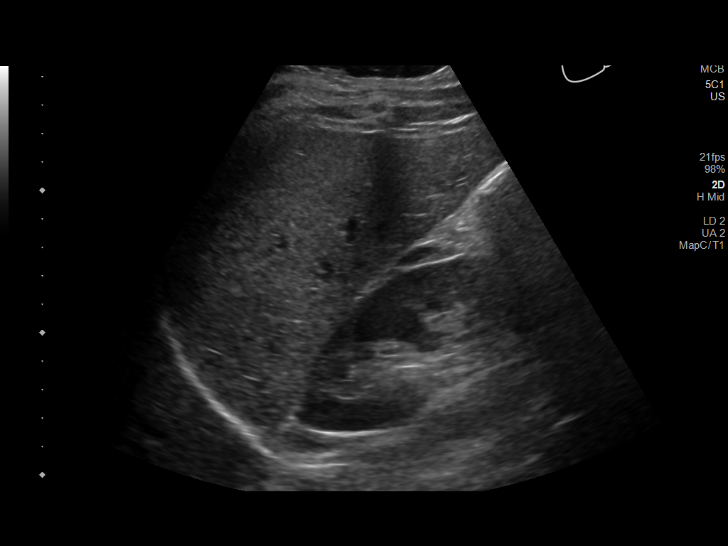
[im 47/47]
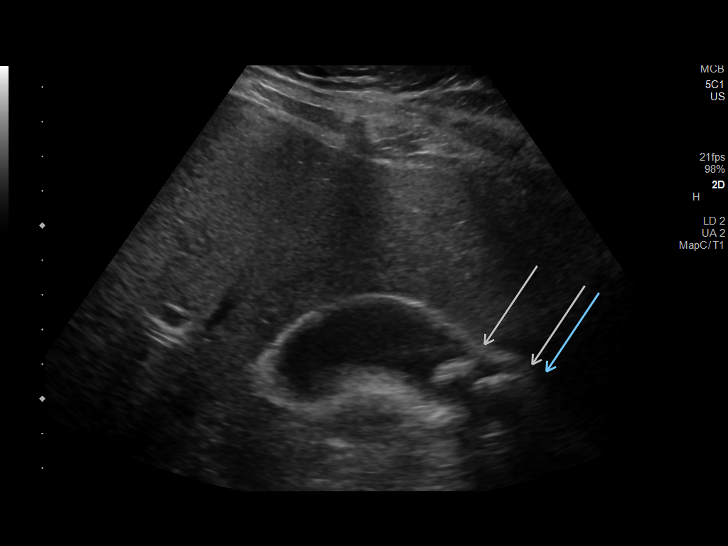

[14 of 25 positions shown; findings below may reference images not displayed]

FINDINGS: Gallbladder:

Two shadowing gallstones measuring up to 16 mm. No wall thickening
or focal tenderness.

Common bile duct:

Diameter: 3 mm

Liver:

No focal lesion identified. Within normal limits in parenchymal
echogenicity. Portal vein is patent on color Doppler imaging with
normal direction of blood flow towards the liver.
IMPRESSION: Cholelithiasis without evidence of cholecystitis.

## 2022-12-10 ENCOUNTER — Telehealth: Payer: Self-pay

## 2022-12-10 NOTE — Telephone Encounter (Signed)
Mychart msg sent

## 2023-11-02 DIAGNOSIS — R040 Epistaxis: Secondary | ICD-10-CM | POA: Diagnosis not present

## 2023-11-02 DIAGNOSIS — R519 Headache, unspecified: Secondary | ICD-10-CM | POA: Diagnosis not present

## 2024-04-23 DIAGNOSIS — M25562 Pain in left knee: Secondary | ICD-10-CM | POA: Diagnosis not present

## 2024-04-23 DIAGNOSIS — Z6841 Body Mass Index (BMI) 40.0 and over, adult: Secondary | ICD-10-CM | POA: Diagnosis not present

## 2024-04-23 DIAGNOSIS — E669 Obesity, unspecified: Secondary | ICD-10-CM | POA: Diagnosis not present
